# Patient Record
Sex: Male | Born: 1955
Health system: Southern US, Community
[De-identification: ages and names within clinical notes are randomized; demographics above are authoritative.]

## PROBLEM LIST (undated history)

## (undated) DIAGNOSIS — E785 Hyperlipidemia, unspecified: Secondary | ICD-10-CM

## (undated) DIAGNOSIS — R569 Unspecified convulsions: Secondary | ICD-10-CM

## (undated) DIAGNOSIS — I2510 Atherosclerotic heart disease of native coronary artery without angina pectoris: Secondary | ICD-10-CM

## (undated) DIAGNOSIS — H5440 Blindness, one eye, unspecified eye: Secondary | ICD-10-CM

## (undated) HISTORY — PX: ANKLE SURGERY: SHX546

## (undated) HISTORY — PX: TONSILLECTOMY: SUR1361

## (undated) HISTORY — DX: Blindness, one eye, unspecified eye: H54.40

## (undated) HISTORY — DX: Hyperlipidemia, unspecified: E78.5

## (undated) HISTORY — DX: Atherosclerotic heart disease of native coronary artery without angina pectoris: I25.10

## (undated) HISTORY — PX: EYE SURGERY: SHX253

---

## 2008-02-17 ENCOUNTER — Encounter: Admission: RE | Admit: 2008-02-17 | Discharge: 2008-02-17 | Payer: Self-pay | Admitting: Emergency Medicine

## 2011-12-14 ENCOUNTER — Emergency Department (HOSPITAL_COMMUNITY): Payer: BC Managed Care – PPO

## 2011-12-14 ENCOUNTER — Observation Stay (HOSPITAL_COMMUNITY)
Admission: EM | Admit: 2011-12-14 | Discharge: 2011-12-15 | Disposition: A | Discharge status: Home / Self Care | Payer: BC Managed Care – PPO | Attending: Emergency Medicine | Admitting: Emergency Medicine

## 2011-12-14 ENCOUNTER — Encounter (HOSPITAL_COMMUNITY): Payer: Self-pay

## 2011-12-14 DIAGNOSIS — R079 Chest pain, unspecified: Principal | ICD-10-CM | POA: Insufficient documentation

## 2011-12-14 DIAGNOSIS — I2510 Atherosclerotic heart disease of native coronary artery without angina pectoris: Secondary | ICD-10-CM

## 2011-12-14 DIAGNOSIS — Z8249 Family history of ischemic heart disease and other diseases of the circulatory system: Secondary | ICD-10-CM | POA: Insufficient documentation

## 2011-12-14 DIAGNOSIS — R918 Other nonspecific abnormal finding of lung field: Secondary | ICD-10-CM | POA: Insufficient documentation

## 2011-12-14 DIAGNOSIS — R0602 Shortness of breath: Secondary | ICD-10-CM | POA: Insufficient documentation

## 2011-12-14 DIAGNOSIS — R51 Headache: Secondary | ICD-10-CM | POA: Insufficient documentation

## 2011-12-14 HISTORY — DX: Unspecified convulsions: R56.9

## 2011-12-14 LAB — CBC
HCT: 39.9 % (ref 39.0–52.0)
Hemoglobin: 13.7 g/dL (ref 13.0–17.0)
MCH: 32.8 pg (ref 26.0–34.0)
MCHC: 34.3 g/dL (ref 30.0–36.0)
MCV: 95.5 fL (ref 78.0–100.0)
Platelets: 228 K/uL (ref 150–400)
RBC: 4.18 MIL/uL — ABNORMAL LOW (ref 4.22–5.81)
RDW: 12.6 % (ref 11.5–15.5)
WBC: 6.8 K/uL (ref 4.0–10.5)

## 2011-12-14 LAB — BASIC METABOLIC PANEL WITH GFR
BUN: 17 mg/dL (ref 6–23)
CO2: 23 meq/L (ref 19–32)
Calcium: 8.8 mg/dL (ref 8.4–10.5)
Chloride: 103 meq/L (ref 96–112)
Creatinine, Ser: 1.09 mg/dL (ref 0.50–1.35)
GFR calc Af Amer: 88 mL/min — ABNORMAL LOW
GFR calc non Af Amer: 76 mL/min — ABNORMAL LOW
Glucose, Bld: 87 mg/dL (ref 70–99)
Potassium: 3.9 meq/L (ref 3.5–5.1)
Sodium: 137 meq/L (ref 135–145)

## 2011-12-14 NOTE — ED Provider Notes (Signed)
Assumed pt care in CDU.  Pt is a 56 year old male with family hx for cardiac disease is being placed under CP protocol due to having chest pain which resolved after receiving ASA and SL nitro .  Pt is cp free.  Pt has normal ECG and negative initial trop I.  Will undergo coronary CT tomorrow.    10:17 PM On exam pt in NAD, VSS, CP free, heart RRR, S1S2 no M/R/G, lung CTAB, abd soft and nontender, no pedal edema.  Will continue protocol  11:44 PM Report given to dr. Clarene Duke at end of shift.  Pt to have coronary CT tomorrow morning.   Fayrene Helper, PA-C 12/14/11 2344

## 2011-12-14 NOTE — ED Provider Notes (Signed)
History     CSN: [id number]  Arrival date & time 12/14/11  1557   First MD Initiated Contact with Patient 12/14/11 1650      Chief Complaint  Patient presents with  . Chest Pain    (Consider location/radiation/quality/duration/timing/severity/associated sxs/prior treatment) HPI Comments: Patient with no past cardiac history presents with complaint of midsternal chest tightness that began approximately 12:30. Patient went to [facility] with the pain and was given nitroglycerin and aspirin. Pain resolved upon administration of nitroglycerin. Patient states that the pain did not radiate. It was associated with shortness of breath. It is not associated with diaphoresis, nausea, palpitations. Patient does not have a history of cardiac catheterization or stress test. Patient also notes that last night when he was running on his treadmill he had shortness of breath that was worse than usual however no chest pain.  Patient is a 56 y.o. male presenting with chest pain. The history is provided by the patient.  Chest Pain The chest pain began 3 - 5 hours ago. Duration of episode(s) is 90 minutes. Chest pain occurs constantly. The chest pain is resolved. The severity of the pain is moderate. The quality of the pain is described as tightness. The pain does not radiate. Primary symptoms include shortness of breath. Pertinent negatives for primary symptoms include no fever, no fatigue, no syncope, no cough, no wheezing, no palpitations, no abdominal pain, no nausea and no vomiting.  Pertinent negatives for associated symptoms include no diaphoresis.  Pertinent negatives for past medical history include no MI.  His family medical history is significant for hypertension in family.  Procedure history is negative for cardiac catheterization, stress echo and exercise treadmill test.     Past Medical History  Diagnosis Date  . Seizures     Past Surgical History  Procedure Date  . Ankle surgery    . Eye surgery     No family history on file.  History  Substance Use Topics  . Smoking status: Never Smoker   . Smokeless tobacco: Not on file  . Alcohol Use: No      Review of Systems  Constitutional: Negative for fever, diaphoresis and fatigue.  HENT: Negative for neck pain.   Eyes: Negative for redness.  Respiratory: Positive for shortness of breath. Negative for cough and wheezing.   Cardiovascular: Positive for chest pain. Negative for palpitations, leg swelling and syncope.  Gastrointestinal: Negative for nausea, vomiting and abdominal pain.  Genitourinary: Negative for dysuria.  Musculoskeletal: Negative for back pain.  Skin: Negative for rash.  Neurological: Negative for syncope and light-headedness.    Allergies  Review of patient's allergies indicates no known allergies.  Home Medications   Current Outpatient Rx  Name Route Sig Dispense Refill  . ASPIRIN 81 MG PO CHEW Oral Chew 81 mg by mouth daily.    Marland Kitchen CARBAMAZEPINE ER 200 MG PO TB12 Oral Take 200-300 mg by mouth 2 (two) times daily. Takes 200 mg in the morning and 300 mg at night    . ADULT MULTIVITAMIN W/MINERALS CH Oral Take 1 tablet by mouth daily.      BP 101/58  Pulse 64  Temp 98.1 F (36.7 C) (Oral)  Resp 15  Ht 5\' 7"  (1.702 m)  Wt 145 lb (65.772 kg)  BMI 22.71 kg/m2  SpO2 100%  Physical Exam  Nursing note and vitals reviewed. Constitutional: He appears well-developed and well-nourished.  HENT:  Head: Normocephalic and atraumatic.  Mouth/Throat: Mucous membranes are normal. Mucous membranes  are not dry.  Eyes: Conjunctivae are normal.  Neck: Trachea normal and normal range of motion. Neck supple. Normal carotid pulses and no JVD present. No muscular tenderness present. Carotid bruit is not present. No tracheal deviation present.  Cardiovascular: Normal rate, regular rhythm, S1 normal, S2 normal, normal heart sounds and intact distal pulses.  Exam reveals no distant heart sounds and no  decreased pulses.   No murmur heard. Pulmonary/Chest: Effort normal and breath sounds normal. No respiratory distress. He has no wheezes. He exhibits no tenderness.  Abdominal: Soft. Normal aorta and bowel sounds are normal. There is no tenderness. There is no rebound and no guarding.  Musculoskeletal: He exhibits no edema.  Neurological: He is alert.  Skin: Skin is warm and dry. He is not diaphoretic. No cyanosis. No pallor.  Psychiatric: He has a normal mood and affect.    ED Course  Procedures (including critical care time)  Labs Reviewed  CBC - Abnormal; Notable for the following:    RBC 4.18 (*)     All other components within normal limits  BASIC METABOLIC PANEL - Abnormal; Notable for the following:    GFR calc non Af Amer 76 (*)     GFR calc Af Amer 88 (*)     All other components within normal limits   Dg Chest 2 View  12/14/2011  *RADIOLOGY REPORT*  Clinical Data: 56 year old male with chest pain and shortness of breath.  CHEST - 2 VIEW  Comparison: None.  Findings: Normal lung volumes.  No pneumothorax, pulmonary edema or pleural effusion.  No confluent pulmonary opacity.  EKG button artifact suspected over the right hilum.  Cardiac size and mediastinal contours are within normal limits.  Visualized tracheal air column is within normal limits.  Mild scoliosis. No acute osseous abnormality identified.  IMPRESSION: Negative, no acute cardiopulmonary abnormality.   Original Report Authenticated By: Harley Hallmark, M.D.      1. Chest pain     5:06 PM Patient seen and examined. EKG reviewed. ASA given PTA. Work-up initiated.   Vital signs reviewed and are as follows: Filed Vitals:   12/14/11 1608  BP: 101/58  Pulse: 64  Temp: 98.1 F (36.7 C)  Resp: 15   5:20 PM D/w Dr. Effie Shy. Anticipate CPP if markers negative.   6:47 PM Per mini lab -- trop I neg 0.00. Not crossing over in EPIC.   Patient placed on CDU CPP. Handoff to Ardelle Park.    MDM  CPP, pending coronary  CT in AM.         Renne Crigler, PA 12/14/11 2316

## 2011-12-14 NOTE — ED Provider Notes (Signed)
Kenneth Olson is a 56 y.o. male nonspecific chest pain, with negative initial evaluation ED. Patient has elevated cardiac risk factors and will need risk stratification. His good candidate for cardiac CT, to evaluate for potential cardiac disease. He would be stable for discharge if the cardiac CT, does not show elevated calcium score. Patient is calm, comfortable, and without pain in the emergency department.   Medical screening examination/treatment/procedure(s) were conducted as a shared visit with non-physician practitioner(s) and myself.  I personally evaluated the patient during the encounter  Flint Melter, MD 12/15/11 [phone]

## 2011-12-14 NOTE — ED Notes (Signed)
Patient was brought in by ambulance from his Physician's office with chest pain onset at 1230 when he woke up from a nap with SOB. Patient stated that when he was using the treadmill last night he said he ran out of breath quickly but no chest pain. Patient was given 4 Baby ASA and 2 NTG SL PTA with relief. Patient is A/A/Ox4, skin is warm and dry, respiration is even and unlabored.

## 2011-12-15 ENCOUNTER — Observation Stay (HOSPITAL_COMMUNITY): Payer: BC Managed Care – PPO

## 2011-12-15 LAB — POCT I-STAT TROPONIN I
Troponin i, poc: 0 ng/mL (ref 0.00–0.08)
Troponin i, poc: 0 ng/mL (ref 0.00–0.08)

## 2011-12-15 IMAGING — CT CT HEART MORP W/ CTA COR W/ SCORE W/ CA W/CM &/OR W/O CM
1 series · 14 of 20 positions shown, 18 images · IV contrast (omnipaque)
Comparison: Chest radiograph 12/14/2011

INDICATION: 55 year-old male with chest pain.

CT ANGIOGRAPHY OF THE HEART, CORONARY ARTERY, STRUCTURE, AND
MORPHOLOGY
CONTRAST: 80mL OMNIPAQUE IOHEXOL 350 MG/ML SOLN
TECHNIQUE: CT angiography of the coronary vessels was performed on
a 256 channel system using prospective ECG gating.  A scout and
noncontrast exam (for calcium scoring) were performed.  Circulation
time was measured using a test bolus.  Coronary CTA was performed
with sub mm slice collimation during portions of the cardiac cycle
after prior injection of iodinated contrast.  Imaging post
processing was performed on an independent workstation creating
multiplanar and 3-D images, and quantitative analysis of the heart
and coronary arteries.  Note that this exam targets the heart and
the chest was not imaged in its entirety.
PREMEDICATION:
Lopressor 50 mg, P.O.
Nitroglycerin 0.4 mcg, sublingual.

[Series 2: calcium score · axial · 0.49mm/px · z∈[-215,-110]mm · 14 of 48 slices shown, 18 images]
[im 3/48  vessel]
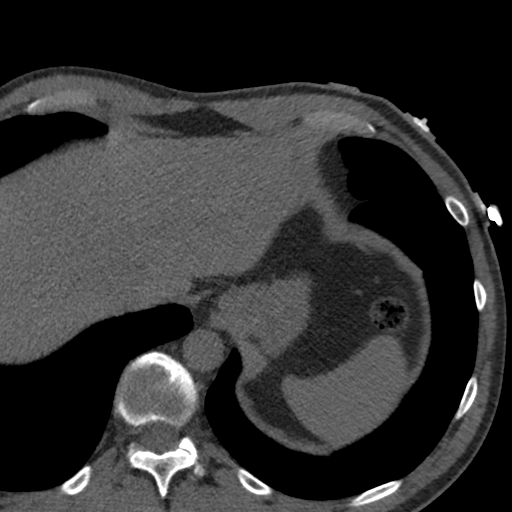
[im 3/48  lung]
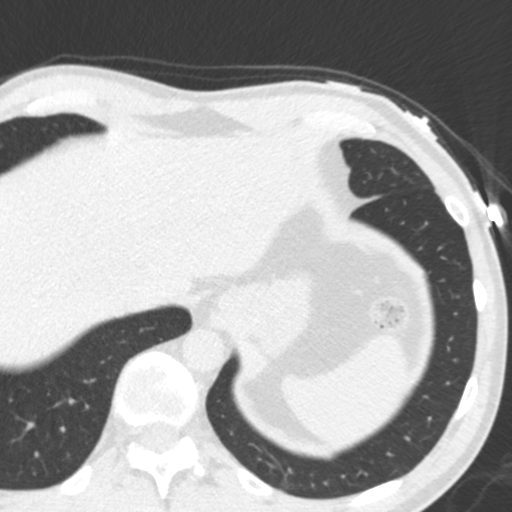
[im 5/48  vessel]
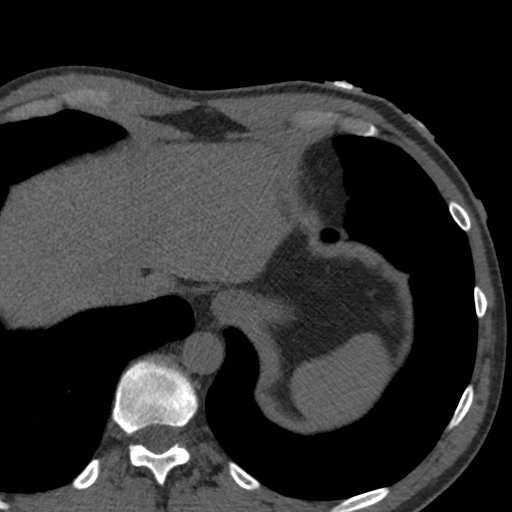
[im 10/48  vessel]
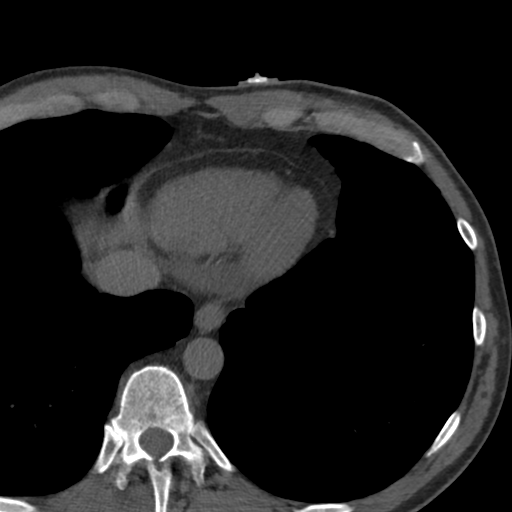
[im 13/48  vessel]
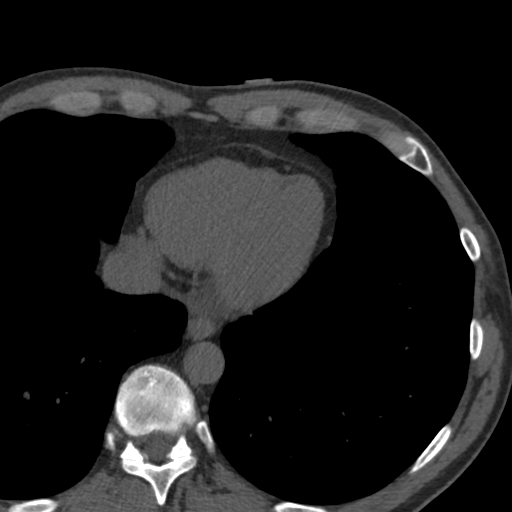
[im 15/48  vessel]
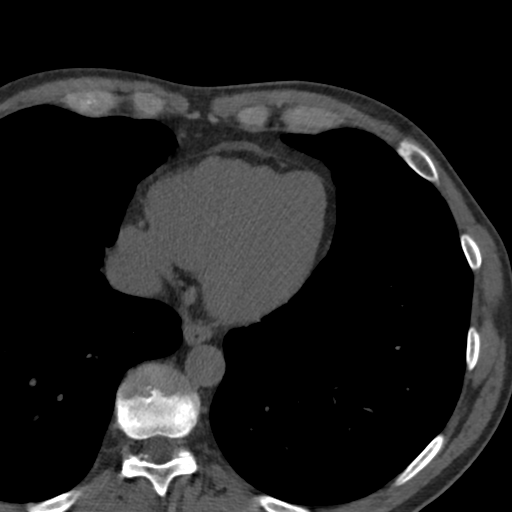
[im 15/48  lung]
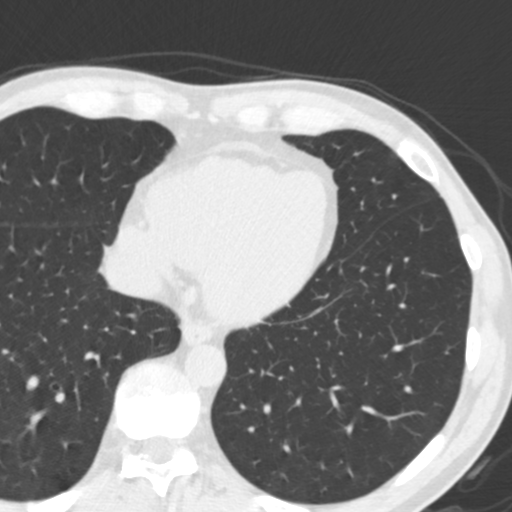
[im 20/48  vessel]
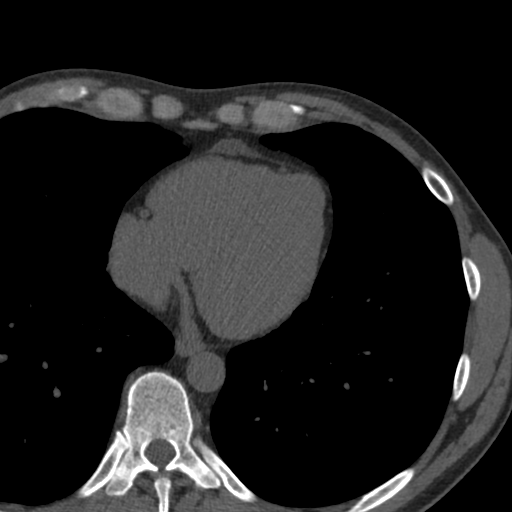
[im 23/48  vessel]
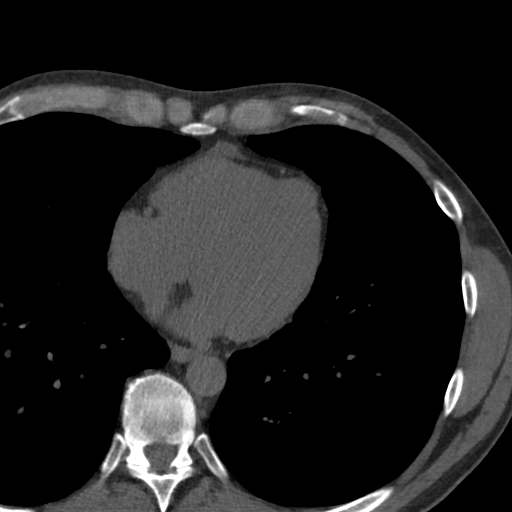
[im 25/48  vessel]
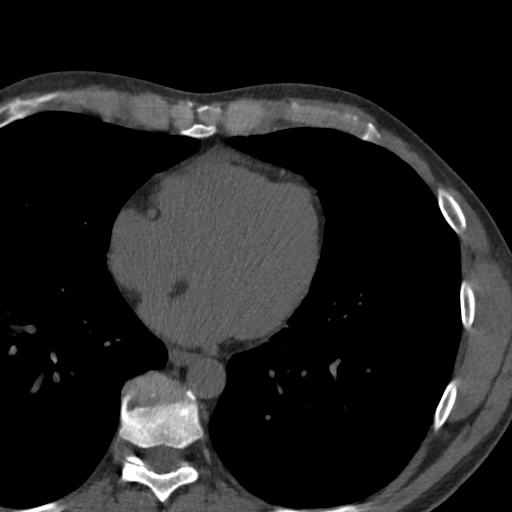
[im 28/48  vessel]
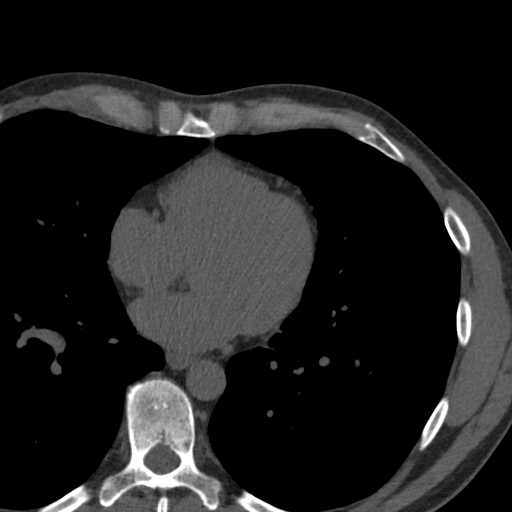
[im 28/48  lung]
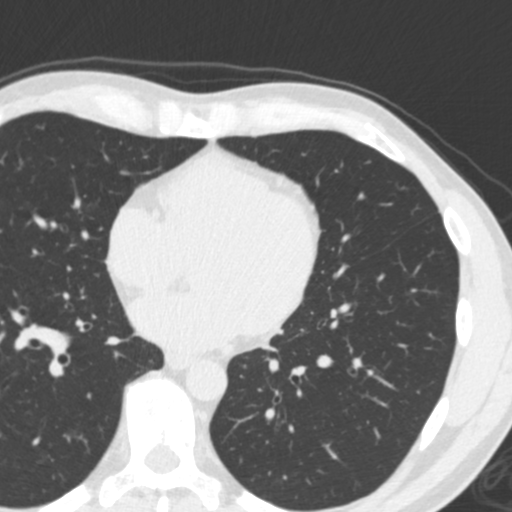
[im 33/48  vessel]
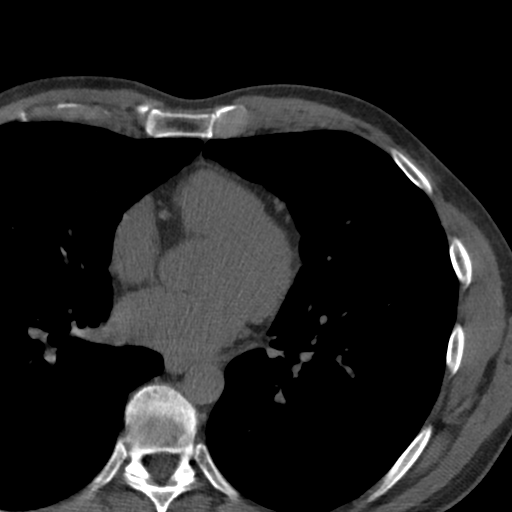
[im 35/48  vessel]
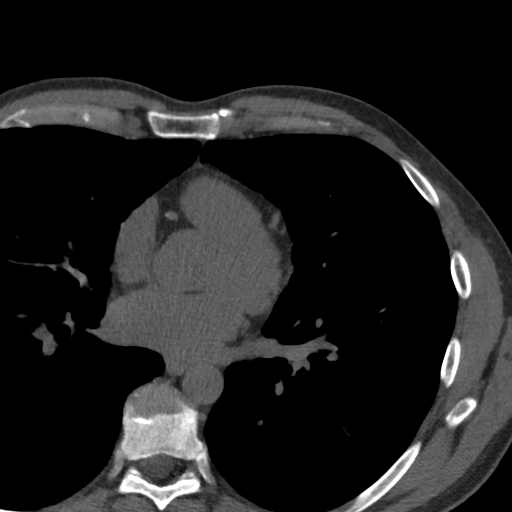
[im 38/48  vessel]
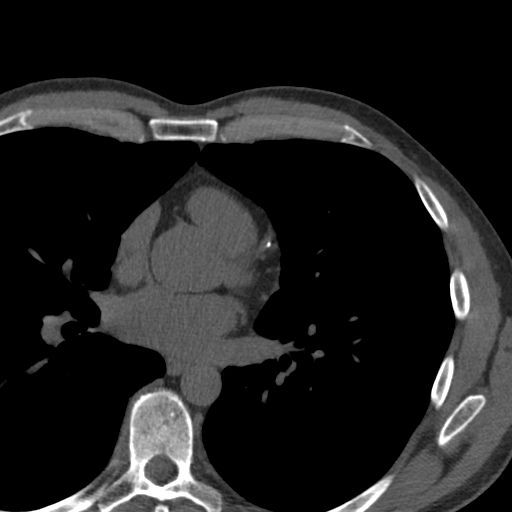
[im 43/48  vessel]
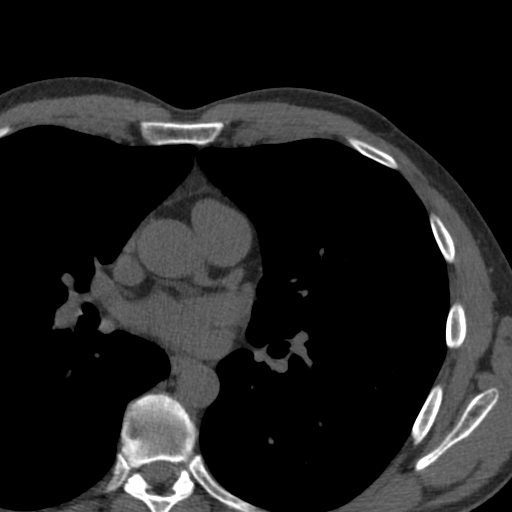
[im 43/48  lung]
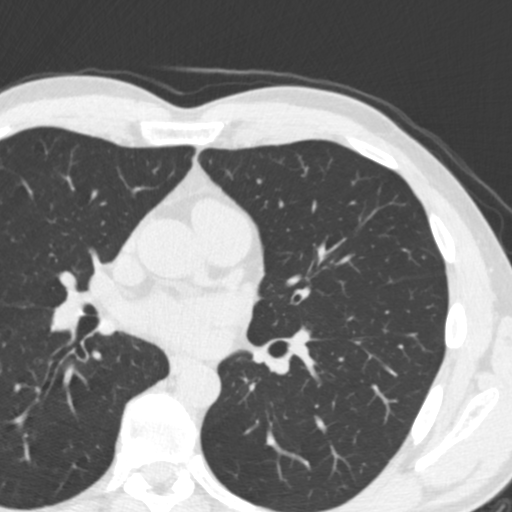
[im 45/48  vessel]
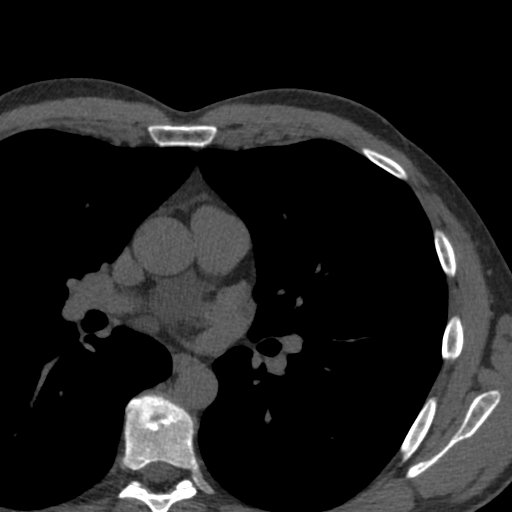

[14 of 20 positions shown; findings below may reference images not displayed]

FINDINGS: Technical quality:  Good

Heart rate:  57 beats per minute

CORONARY ARTERIES:
Left main coronary artery:  Normal location and widely patent.
Left anterior descending:  There are foci of calcified plaque
involving the LAD.  There is mild narrowing of the distal LAD
associated with calcified and noncalcified plaque.  This appears to
be less than 50% narrowing.  There are small diagonal branches.
Left circumflex Left circumflex:  Patent with a prominent obtuse
marginal branch.
Right coronary artery:  Small amount of calcified plaque without
occlusive disease.
Posterior descending artery:  Normal.
Dominance:  Right

CORONARY CALCIUM:
Total Agatston Score:
[HOSPITAL] percentile:  82%

CARDIAC MEASUREMENTS:
Interventricular septum (6 - 12 mm):  9 mm
LV posterior wall (6 - 12 mm):  13 mm
LV diameter in diastole (35 - 52 mm):  33 mm

AORTA AND PULMONARY MEASUREMENTS:
Aortic root (21 - 40 mm):

            28 mm  at the annulus
            35 mm  at the sinuses of Valsalva
            25 mm  at the sinotubular junction
Ascending aorta ( <  40 mm):  26 mm
Descending aorta ( <  40 mm):  20 mm
Main pulmonary artery:  ( <  30 mm):  23 mm

EXTRACARDIAC FINDINGS:
Small amount of fluid within the pericardial recess.  No evidence
for chest lymphadenopathy.  No evidence for a central pulmonary
embolism.  There is a tiny subpleural nodule measuring 3 mm on
sequence #4, image 8.  2 mm peripheral nodule in the left lower
lobe on image 14, sequence #4. Otherwise, the visualized lungs are
clear
IMPRESSION: 1.  Nonocclusive coronary artery disease.  The patient's total
coronary artery calcium score is 86.4, which is 82% percentile for
patient's matched age and gender.
2.  They are two small left pulmonary nodules.  These findings are
nonspecific. If the patient is at high risk for bronchogenic
carcinoma, follow-up chest CT at 1 year is recommended.  If the
patient is at low risk, no follow-up is needed.  This
recommendation follows the consensus statement: Guidelines for
Management of Small Pulmonary Nodules Detected on CT Scans:  A
Statement from the [HOSPITAL] as published in Radiology
7333; [DATE].
3.  Right coronary artery dominance.

12/15/2011.

## 2011-12-15 MED ORDER — ACETAMINOPHEN 325 MG PO TABS
975.0000 mg | ORAL_TABLET | Freq: Four times a day (QID) | ORAL | Status: DC | PRN
  Administered 2011-12-15: 975 mg via ORAL
  Filled 2011-12-15: qty 3

## 2011-12-15 MED ORDER — IOHEXOL 350 MG/ML SOLN
80.0000 mL | Freq: Once | INTRAVENOUS | Status: AC | PRN
  Administered 2011-12-15: 80 mL via INTRAVENOUS

## 2011-12-15 MED ORDER — METOPROLOL TARTRATE 1 MG/ML IV SOLN
INTRAVENOUS | Status: AC
  Filled 2011-12-15: qty 15

## 2011-12-15 MED ORDER — ACETAMINOPHEN 500 MG PO TABS
1000.0000 mg | ORAL_TABLET | Freq: Once | ORAL | Status: DC
  Filled 2011-12-15: qty 2

## 2011-12-15 MED ORDER — METOPROLOL TARTRATE 25 MG PO TABS
50.0000 mg | ORAL_TABLET | Freq: Once | ORAL | Status: AC
  Administered 2011-12-15: 50 mg via ORAL
  Filled 2011-12-15: qty 2

## 2011-12-15 NOTE — ED Provider Notes (Signed)
7:35 AM BP 113/61  Pulse 61  Temp 98.5 F (36.9 C) (Oral)  Resp 14  Ht 5\' 7"  (1.702 m)  Wt 145 lb (65.772 kg)  BMI 22.71 kg/m2  SpO2 100% A single care of patient in CDU. Patient is here on chest pain protocol. Had an episode of substernal chest pain associated with tightness yesterday. Relieved with nitroglycerin and aspirin. Negative troponins and EKG. Positive family history of cardiac disease. Heart rate is running approximately 50  for right now after Lopressor. He is awaiting transport to get his CT angiogram.  9:19 AM Patient is complaining of headache after nitroglycerin administration and CT A. Will give him Tylenol.  9:56 AM CTA report back showing some vessel disease, but less than 50% narrowing. He also has some pulmonary nodules found on the CT.  Patient will be discharged today with cardiology follow up  Regarding his heart and PCP regarding nodules. Spoke with patient and all questions answered fully.  He is safe for discharge at this time. Discussed reasons to seek immediate care. Patient expresses understanding and agrees with plan.   Arthor Captain, PA-C 12/15/11 1017

## 2011-12-15 NOTE — ED Provider Notes (Signed)
Medical screening examination/treatment/procedure(s) were conducted as a shared visit with non-physician practitioner(s) and myself.  I personally evaluated the patient during the encounter  Flint Melter, MD 12/15/11 Moses Manners

## 2011-12-19 NOTE — ED Provider Notes (Signed)
Medical screening examination/treatment/procedure(s) were performed by non-physician practitioner and as supervising physician I was immediately available for consultation/collaboration.  Hurman Horn, MD 12/19/11 [phone]

## 2012-01-10 ENCOUNTER — Encounter: Payer: Self-pay | Admitting: *Deleted

## 2012-01-10 ENCOUNTER — Encounter: Payer: Self-pay | Admitting: Cardiology

## 2012-01-13 ENCOUNTER — Encounter: Payer: Self-pay | Admitting: *Deleted

## 2012-01-13 ENCOUNTER — Ambulatory Visit (INDEPENDENT_AMBULATORY_CARE_PROVIDER_SITE_OTHER): Payer: BC Managed Care – PPO | Admitting: Cardiology

## 2012-01-13 VITALS — BP 128/76 | HR 57 | Ht 67.0 in | Wt 148.0 lb

## 2012-01-13 DIAGNOSIS — I25810 Atherosclerosis of coronary artery bypass graft(s) without angina pectoris: Secondary | ICD-10-CM

## 2012-01-13 DIAGNOSIS — E785 Hyperlipidemia, unspecified: Secondary | ICD-10-CM

## 2012-01-13 MED ORDER — PRAVASTATIN SODIUM 40 MG PO TABS: 40.0000 mg | ORAL_TABLET | Freq: Every day | ORAL | Status: DC

## 2012-01-13 NOTE — Assessment & Plan Note (Signed)
Given documented plaque will add Pravachol 40 mg daily and check lipids and liver in 6 weeks.

## 2012-01-13 NOTE — Patient Instructions (Addendum)
Your physician recommends that you schedule a follow-up appointment in: 4-6 weeks  Your physician has recommended you make the following change in your medication:   START PRAVACHOL 40 MG DAILY  Your physician recommends that you return for a FASTING lipid profile: 4-6 WEEKS ON FOLLOW UP APPOINTMENT

## 2012-01-13 NOTE — Progress Notes (Signed)
  HPI: 56 year old male with no prior cardiac history for evaluation of chest pain. Patient recently seen in August of 2013 in the emergency room with chest pain. Cardiac markers negative, hemoglobin 13.7, BUN and creatinine 17 and 1.09. Chest x-ray negative. Cardiac CT revealed normal left main. There was calcified plaque in the LAD. This appeared to be less than 50%. The circumflex was patent. There was mild plaque in the right coronary artery and PDA was normal. Calcium score 86.4%. There were small left pulmonary nodules and followup recommended. No large pulmonary embolus. On the day he was seen in the emergency room he had used a chainsaw. He subsequently developed a dull substernal chest pain that did not radiate. It was not pleuritic or positional. There was associated dyspnea but no nausea or diaphoresis.  It lasted approximately 5 hours and resolved. Since that time he has noticed some increased dyspnea on exertion and fatigue. He occasionally has had 30 seconds of substernal dull chest pain not related to exertion.  Current Outpatient Prescriptions  Medication Sig Dispense Refill  . aspirin 81 MG chewable tablet Chew 81 mg by mouth daily.      . carbamazepine (TEGRETOL XR) 200 MG 12 hr tablet Take 200-300 mg by mouth 2 (two) times daily. Takes 200 mg in the morning and 300 mg at night      . Multiple Vitamin (MULTIVITAMIN WITH MINERALS) TABS Take 1 tablet by mouth daily.      Marland Kitchen neomycin-polymyxin b-dexamethasone (MAXITROL) 3.5-10000-0.1 OINT prn        No Known Allergies  Past Medical History  Diagnosis Date  . Seizures   . Chest pain   . Hyperlipidemia   . Blindness of right eye     childhood incident    Past Surgical History  Procedure Date  . Ankle surgery   . Eye surgery     History   Social History  . Marital Status: Single    Spouse Name: N/A    Number of Children: N/A  . Years of Education: N/A   Occupational History  . Not on file.   Social History Main Topics   . Smoking status: Never Smoker   . Smokeless tobacco: Not on file  . Alcohol Use: No  . Drug Use: No  . Sexually Active:    Other Topics Concern  . Not on file   Social History Narrative  . No narrative on file    Family History  Problem Relation Age of Onset  . Stroke Mother   . Heart attack Father     ROS:  no fevers or chills, productive cough, hemoptysis, dysphasia, odynophagia, melena, hematochezia, dysuria, hematuria, rash, seizure activity, orthopnea, PND, pedal edema, claudication. Remaining systems are negative.  Physical Exam:   Blood pressure 128/76, pulse 57, height 5\' 7"  (1.702 m), weight 148 lb (67.132 kg).  General:  Well developed/well nourished in NAD Skin warm/dry Patient not depressed No peripheral clubbing Back-normal HEENT-normal/normal eyelids Neck supple/normal carotid upstroke bilaterally; no bruits; no JVD; no thyromegaly chest - CTA/ normal expansion CV - RRR/normal S1 and S2; no murmurs, rubs or gallops;  PMI nondisplaced Abdomen -NT/ND, no HSM, no mass, + bowel sounds, no bruit 2+ femoral pulses, no bruits Ext-no edema, chords, 2+ DP Neuro-grossly nonfocal  ECG sinus rhythm at a rate of 57. No ST changes.

## 2012-01-13 NOTE — Assessment & Plan Note (Signed)
Patient had an episode of chest pain and etiology not clear. Enzymes were negative. Cardiac CT showed plaque but no obstructive disease. There was no large pulmonary embolus and he has no risk factors for this particular problem. Plan continue aspirin. Add Pravachol 40 mg daily. Increase activities. If recurrent symptoms in the future he may require cardiac catheterization for more definitive evaluation. Note his electrocardiogram is normal.

## 2012-02-10 ENCOUNTER — Telehealth: Payer: Self-pay | Admitting: Cardiology

## 2012-02-10 NOTE — Telephone Encounter (Signed)
Pt started Pravastatin about a month ago.  He has had a pimple-like rash since that time.  It lasted a couple weeks ago and now started again.  He has also been having dizziness and an off-balance type feeling off and on since starting the pravastatin which has been getting progressively worse.   He was told to stop the pravastatin and that I will forward this message to Dr Jens Som for his advice.  Pt agrees.

## 2012-02-10 NOTE — Telephone Encounter (Signed)
Dc pravachol and begin lipitor 10 mg po daily with lipids and liver in six weeks Kenneth Olson

## 2012-02-10 NOTE — Telephone Encounter (Signed)
New Problem:    Patient called in because after taking his pravastatin (PRAVACHOL) 40 MG tablet he is dizzy and his face has broken out in adult acne.  Patient called in wanting to know if there is any other statin medication that he could take.  Please call back.

## 2012-02-12 MED ORDER — ATORVASTATIN CALCIUM 10 MG PO TABS: 10.0000 mg | ORAL_TABLET | Freq: Every day | ORAL | Status: DC

## 2012-02-12 NOTE — Telephone Encounter (Signed)
Spoke with pt, aware of med change. Script sent to [facility]

## 2012-02-12 NOTE — Telephone Encounter (Signed)
Follow-up:    Patient returned your call.  Please call back. 

## 2012-02-12 NOTE — Telephone Encounter (Signed)
Left message for pt to call.

## 2012-02-20 ENCOUNTER — Other Ambulatory Visit: Payer: BC Managed Care – PPO

## 2012-02-20 ENCOUNTER — Ambulatory Visit: Payer: BC Managed Care – PPO | Admitting: Cardiology

## 2012-03-09 ENCOUNTER — Encounter: Payer: BC Managed Care – PPO | Admitting: Cardiology

## 2012-03-09 ENCOUNTER — Encounter: Payer: Self-pay | Admitting: Cardiology

## 2012-03-09 ENCOUNTER — Other Ambulatory Visit: Payer: BC Managed Care – PPO

## 2012-03-09 ENCOUNTER — Telehealth: Payer: Self-pay | Admitting: Cardiology

## 2012-03-09 NOTE — Progress Notes (Signed)
   HPI: Pleasant male for fu of CAD. Patient seen in August of 2013 in the emergency room with chest pain. Cardiac markers negative, hemoglobin 13.7, BUN and creatinine 17 and 1.09. Chest x-ray negative. Cardiac CT revealed normal left main. There was calcified plaque in the LAD. This appeared to be less than 50%. The circumflex was patent. There was mild plaque in the right coronary artery and PDA was normal. Calcium score 86.4%. There were small left pulmonary nodules and followup recommended. No large pulmonary embolus. Since I saw him in Sept 2013,    Current Outpatient Prescriptions  Medication Sig Dispense Refill  . aspirin 81 MG chewable tablet Chew 81 mg by mouth daily.      Marland Kitchen atorvastatin (LIPITOR) 10 MG tablet Take 1 tablet (10 mg total) by mouth daily.  30 tablet  12  . carbamazepine (TEGRETOL XR) 200 MG 12 hr tablet Take 200-300 mg by mouth 2 (two) times daily. Takes 200 mg in the morning and 300 mg at night      . Multiple Vitamin (MULTIVITAMIN WITH MINERALS) TABS Take 1 tablet by mouth daily.      Marland Kitchen neomycin-polymyxin b-dexamethasone (MAXITROL) 3.5-10000-0.1 OINT prn         Past Medical History  Diagnosis Date  . Seizures   . Hyperlipidemia   . Blindness of right eye     childhood incident  . CAD (coronary artery disease)     Past Surgical History  Procedure Date  . Ankle surgery   . Eye surgery   . Tonsillectomy     History   Social History  . Marital Status: Married    Spouse Name: N/A    Number of Children: 2  . Years of Education: N/A   Occupational History  .      IT consultant   Social History Main Topics  . Smoking status: Never Smoker   . Smokeless tobacco: Not on file  . Alcohol Use: Yes     Comment: Occasional  . Drug Use: No  . Sexually Active: Not on file   Other Topics Concern  . Not on file   Social History Narrative  . No narrative on file    ROS: no fevers or chills, productive cough, hemoptysis, dysphasia, odynophagia, melena,  hematochezia, dysuria, hematuria, rash, seizure activity, orthopnea, PND, pedal edema, claudication. Remaining systems are negative.  Physical Exam: Well-developed well-nourished in no acute distress.  Skin is warm and dry.  HEENT is normal.  Neck is supple.  Chest is clear to auscultation with normal expansion.  Cardiovascular exam is regular rate and rhythm.  Abdominal exam nontender or distended. No masses palpated. Extremities show no edema. neuro grossly intact  ECG     This encounter was created in error - please disregard.

## 2012-03-09 NOTE — Telephone Encounter (Signed)
New Problem:    Patient missed his appointment and wanted to know when he needed to be seen and if he needed to be seen by Dr. Jens Som or if a NP/PA would be ok.  Please call back.

## 2012-03-09 NOTE — Telephone Encounter (Signed)
appt made

## 2012-03-12 ENCOUNTER — Other Ambulatory Visit (INDEPENDENT_AMBULATORY_CARE_PROVIDER_SITE_OTHER): Payer: BC Managed Care – PPO

## 2012-03-12 ENCOUNTER — Ambulatory Visit (INDEPENDENT_AMBULATORY_CARE_PROVIDER_SITE_OTHER): Payer: BC Managed Care – PPO | Admitting: Nurse Practitioner

## 2012-03-12 ENCOUNTER — Encounter: Payer: Self-pay | Admitting: Nurse Practitioner

## 2012-03-12 VITALS — BP 112/84 | HR 60 | Ht 67.0 in | Wt 151.0 lb

## 2012-03-12 DIAGNOSIS — I259 Chronic ischemic heart disease, unspecified: Secondary | ICD-10-CM

## 2012-03-12 DIAGNOSIS — I25810 Atherosclerosis of coronary artery bypass graft(s) without angina pectoris: Secondary | ICD-10-CM

## 2012-03-12 DIAGNOSIS — E785 Hyperlipidemia, unspecified: Secondary | ICD-10-CM

## 2012-03-12 LAB — HEPATIC FUNCTION PANEL
Albumin: 4.3 g/dL (ref 3.5–5.2)
Total Bilirubin: 0.6 mg/dL (ref 0.3–1.2)

## 2012-03-12 LAB — LIPID PANEL
Cholesterol: 212 mg/dL — ABNORMAL HIGH (ref 0–200)
HDL: 71.7 mg/dL (ref >39.00)
Total CHOL/HDL Ratio: 3
VLDL: 21.4 mg/dL (ref 0.0–40.0)

## 2012-03-12 NOTE — Patient Instructions (Addendum)
I think you are doing well  Stay active  Stay on your medicines  We will check labs today  See Dr. Jens Som in one year  Call the [facility] office at [phone] if you have any questions, problems or concerns.

## 2012-03-12 NOTE — Progress Notes (Signed)
Anne Fu Date of Birth: 1955/09/25 Medical Record #[id number]  History of Present Illness: Mr. Muldrew is seen today for a work in visit. He is seen for Dr. Jens Som. He was here in September after an ER visit for chest pain. Cardiac CT showed no PE, normal left main, calcified plaque in the LAD which appeared to be less than 50%, patent LCX, mild plaque in the RCA and the PDA was normal. Small pulmonary nodules noted and follow up was recommended. 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   He comes in today. He is here alone. He is doing very well. No more chest pain. Not short of breath. Exercises regularly without issue. Did not tolerate Pravachol but doing well on Lipitor. Needs labs today.   Current Outpatient Prescriptions on File Prior to Visit  Medication Sig Dispense Refill  . aspirin 81 MG chewable tablet Chew 81 mg by mouth daily.      Marland Kitchen atorvastatin (LIPITOR) 10 MG tablet Take 1 tablet (10 mg total) by mouth daily.  30 tablet  12  . carbamazepine (TEGRETOL XR) 200 MG 12 hr tablet Take 200-300 mg by mouth 2 (two) times daily. Takes 200 mg in the morning and 300 mg at night      . Multiple Vitamin (MULTIVITAMIN WITH MINERALS) TABS Take 1 tablet by mouth daily.      Marland Kitchen neomycin-polymyxin b-dexamethasone (MAXITROL) 3.5-10000-0.1 OINT 1 application as needed. prn        Allergies  Allergen Reactions  . Pravastatin     Dizziness; acne    Past Medical History  Diagnosis Date  . Seizures   . Hyperlipidemia   . Blindness of right eye     childhood incident  . CAD (coronary artery disease)     Past Surgical History  Procedure Date  . Ankle surgery   . Eye surgery   . Tonsillectomy     History  Smoking status  . Never Smoker   Smokeless tobacco  . Not on file    History  Alcohol Use  . Yes    Comment: Occasional    Family History  Problem Relation Age of Onset  . Stroke Mother   . Heart attack  Father     MI at age 75  . Heart disease Father     Review of Systems: The review of systems is per the HPI.  All other systems were reviewed and are negative.  Physical Exam: BP 112/84  Pulse 60  Ht 5\' 7"  (1.702 m)  Wt 151 lb (68.493 kg)  BMI 23.65 kg/m2 Patient is very pleasant and in no acute distress. Skin is warm and dry. Color is normal.  HEENT is unremarkable. Normocephalic/atraumatic. PERRL. Sclera are nonicteric. Neck is supple. No masses. No JVD. Lungs are clear. Cardiac exam shows a regular rate and rhythm. Abdomen is soft. Extremities are without edema. Gait and ROM are intact. No gross neurologic deficits noted.   LABORATORY DATA:  Lab Results  Component Value Date   WBC 6.8 12/14/2011   HGB 13.7 12/14/2011   HCT 39.9 12/14/2011   PLT 228 12/14/2011   GLUCOSE 87 12/14/2011   NA 137 12/14/2011   K 3.9 12/14/2011   CL 103 12/14/2011   CREATININE 1.09 12/14/2011   BUN 17 12/14/2011   CO2 23 12/14/2011   Dg Chest 2 View  12/14/2011  *RADIOLOGY REPORT*  Clinical Data: 56 year old male with chest  pain and shortness of breath.  CHEST - 2 VIEW  Comparison: None.  Findings: Normal lung volumes.  No pneumothorax, pulmonary edema or pleural effusion.  No confluent pulmonary opacity.  EKG button artifact suspected over the right hilum.  Cardiac size and mediastinal contours are within normal limits.  Visualized tracheal air column is within normal limits.  Mild scoliosis. No acute osseous abnormality identified.  IMPRESSION: Negative, no acute cardiopulmonary abnormality.   Original Report Authenticated By: Harley Hallmark, M.D.    Ct Heart Morp W/cta Cor W/score W/ca W/cm &/or Wo/cm  12/15/2011  *RADIOLOGY REPORT*  INDICATION:  56 year-old male with chest pain.  CT ANGIOGRAPHY OF THE HEART, CORONARY ARTERY, STRUCTURE, AND MORPHOLOGY  CONTRAST: 80mL OMNIPAQUE IOHEXOL 350 MG/ML SOLN  COMPARISON:  Chest radiograph 12/14/2011  TECHNIQUE:  CT angiography of the coronary vessels was performed on  a 256 channel system using prospective ECG gating.  A scout and noncontrast exam (for calcium scoring) were performed.  Circulation time was measured using a test bolus.  Coronary CTA was performed with sub mm slice collimation during portions of the cardiac cycle after prior injection of iodinated contrast.  Imaging post processing was performed on an independent workstation creating multiplanar and 3-D images, and quantitative analysis of the heart and coronary arteries.  Note that this exam targets the heart and the chest was not imaged in its entirety.  PREMEDICATION: Lopressor 50 mg, P.O. Nitroglycerin 0.4 mcg, sublingual.  FINDINGS: Technical quality:  Good  Heart rate:  57 beats per minute  CORONARY ARTERIES: Left main coronary artery:  Normal location and widely patent. Left anterior descending:  There are foci of calcified plaque involving the LAD.  There is mild narrowing of the distal LAD associated with calcified and noncalcified plaque.  This appears to be less than 50% narrowing.  There are small diagonal branches. Left circumflex Left circumflex:  Patent with a prominent obtuse marginal branch. Right coronary artery:  Small amount of calcified plaque without occlusive disease. Posterior descending artery:  Normal. Dominance:  Right  CORONARY CALCIUM: Total Agatston Score:  86.4 MESA database percentile:  82%  CARDIAC MEASUREMENTS: Interventricular septum (6 - 12 mm):  9 mm LV posterior wall (6 - 12 mm):  13 mm LV diameter in diastole (35 - 52 mm):  33 mm  AORTA AND PULMONARY MEASUREMENTS: Aortic root (21 - 40 mm):              28 mm  at the annulus             35 mm  at the sinuses of Valsalva             25 mm  at the sinotubular junction Ascending aorta ( <  40 mm):  26 mm Descending aorta ( <  40 mm):  20 mm Main pulmonary artery:  ( <  30 mm):  23 mm  EXTRACARDIAC FINDINGS: Small amount of fluid within the pericardial recess.  No evidence for chest lymphadenopathy.  No evidence for a central  pulmonary embolism.  There is a tiny subpleural nodule measuring 3 mm on sequence #4, image 8.  2 mm peripheral nodule in the left lower lobe on image 14, sequence #4. Otherwise, the visualized lungs are clear  IMPRESSION:  1.  Nonocclusive coronary artery disease.  The patient's total coronary artery calcium score is 86.4, which is 82% percentile for patient's matched age and gender. 2.  They are two small left pulmonary nodules.  These findings are nonspecific. If the patient is at high risk for bronchogenic carcinoma, follow-up chest CT at 1 year is recommended.  If the patient is at low risk, no follow-up is needed.  This recommendation follows the consensus statement: Guidelines for Management of Small Pulmonary Nodules Detected on CT Scans:  A Statement from the Fleischner Society as published in Radiology 2005; 237:395-400. 3.  Right coronary artery dominance.  Report was called to CDU mid level at [phone] at 9:49 a.m. on 12/15/2011.   Original Report Authenticated By: Richarda Overlie, M.D.     Assessment / Plan: 1. Past episode of atypical chest pain - Cardiac CT showing plaque but no obstructive disease. Continue risk factor modification . I will have him see Dr. Jens Som in a year.  2. HLD - did not tolerate Pravachol but doing well on Lipitor. We will check labs today.  Patient is agreeable to this plan and will call if any problems develop in the interim.

## 2012-03-13 ENCOUNTER — Other Ambulatory Visit: Payer: Self-pay | Admitting: *Deleted

## 2012-03-13 DIAGNOSIS — E7800 Pure hypercholesterolemia, unspecified: Secondary | ICD-10-CM

## 2012-03-13 MED ORDER — ATORVASTATIN CALCIUM 40 MG PO TABS: 40.0000 mg | ORAL_TABLET | Freq: Every day | ORAL | Status: DC

## 2012-06-06 ENCOUNTER — Other Ambulatory Visit: Payer: Self-pay

## 2013-02-25 ENCOUNTER — Other Ambulatory Visit: Payer: Self-pay

## 2013-04-07 ENCOUNTER — Other Ambulatory Visit: Payer: Self-pay | Admitting: Cardiology

## 2013-04-08 ENCOUNTER — Other Ambulatory Visit: Payer: Self-pay | Admitting: Cardiology

## 2013-05-08 ENCOUNTER — Other Ambulatory Visit: Payer: Self-pay | Admitting: Cardiology

## 2013-05-18 ENCOUNTER — Other Ambulatory Visit: Payer: Self-pay | Admitting: *Deleted

## 2013-05-18 MED ORDER — ATORVASTATIN CALCIUM 40 MG PO TABS: ORAL_TABLET | ORAL | Status: DC

## 2013-06-02 ENCOUNTER — Ambulatory Visit: Payer: BC Managed Care – PPO | Admitting: Nurse Practitioner

## 2017-01-20 ENCOUNTER — Ambulatory Visit (INDEPENDENT_AMBULATORY_CARE_PROVIDER_SITE_OTHER): Payer: PRIVATE HEALTH INSURANCE | Admitting: Neurology

## 2017-01-20 ENCOUNTER — Encounter: Payer: Self-pay | Admitting: Neurology

## 2017-01-20 VITALS — BP 122/78 | HR 61 | Ht 67.0 in | Wt 154.0 lb

## 2017-01-20 DIAGNOSIS — G40009 Localization-related (focal) (partial) idiopathic epilepsy and epileptic syndromes with seizures of localized onset, not intractable, without status epilepticus: Secondary | ICD-10-CM

## 2017-01-20 MED ORDER — CARBAMAZEPINE ER 200 MG PO TB12: ORAL_TABLET | ORAL | 12 refills | Status: DC

## 2017-01-20 NOTE — Progress Notes (Signed)
[facility]    Provider:  Dr Lucia Gaskins Referring Provider: Wylene Simmer Adelfa Koh, MD Primary Care Physician:  Gaspar Garbe, MD  CC:  Partial seizure  HPI:  Kenneth Olson is a 61 y.o. male here as a referral from Dr. Wylene Simmer for seizures. Past medical history of partial seizures, syncope. Partial seizures without and with generalization. Partial seizures started in his 25s, thought he was passing out, he had a complete workup and no etiology found. Once he was placed on Tegretol he had no more episodes. Last episode was 10 years ago. He knows when the seizures are coming, feels lightheaded like he is going to pass out, he has time to pull over in the car or sit down. He loses consciousness and then they generalize with bilateral shaking, last a few minutes, denies post-ictal confusion but always vomits, is tired afterwards. No tongue biting or urinating. No head trauma or inciting event. No Fhx of seizures.  Has had DEXA  And was fine. No other focal neurologic deficits, associated symptoms, inciting events or modifiable factors.    Reviewed notes, labs and imaging from outside physicians, which showed:  MRI 01/2008:  Findings: There is no evidence for acute infarction, intracranial hemorrhage, mass lesion, hydrocephalus, or extra-axial fluid. There may be mild atrophy.  There does appear to be mild small vessel disease.  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 There is mild chronic sinus disease.  The  calvarium is intact. Pituitary and cerebellar tonsils are unremarkable.   Post infusion, there is no abnormal enhancement of the brain or meninges.  The major intracranial vascular structures appear widely Patent.  EEG 2011   IMPRESSION: Prosthetic globe unremarkable, right orbit.   No abnormal intracranial enhancement and no acute intracranial Findings.  [facility] [place] Dr.[name], patient was last seen in October 2016  diagnosed with partial epilepsy. Previous to that he was seen in 2012. He is on Tegretol 200 mg 2 pills in the morning and 3 pills at night. Had dizziness on higher doses. He reported no seizures or fainting. He has a past medical history of dizziness. Higher doses of Carbatrol made him dizzy. No additional problems except fatiguing. He was started on cholesterol several years prior. Exercising 3 times per week and no medical changes. Neurologic exam was nonfocal. He was under control, carbamazepine level and vitamin D and CBC and metabolic panel were checked.  Tegretol level 01/2015 11.6  Review of Systems: Patient complains of symptoms per HPI as well as the following symptoms: Snoring, seizures, hearing loss, ringing in ears. Pertinent negatives and positives per HPI. All others negative.   Social History   Social History  . Marital status: Married    Spouse name: N/A  . Number of children: 2  . Years of education: N/A   Occupational History  .      IT consultant   Social History Main Topics  . Smoking status: Never Smoker  . Smokeless tobacco: Never Used  . Alcohol use Yes     Comment: Occasional  . Drug use: No  . Sexual activity: Yes   Other Topics Concern  . Not on file   Social History Narrative  . No narrative on file    Family History  Problem Relation Age of Onset  . Stroke Mother   . Heart attack Father        MI at age 28  . Heart disease Father   .  Seizures Neg Hx     Past Medical History:  Diagnosis Date  . Blindness of right eye    childhood incident  . CAD (coronary artery disease)   . Hyperlipidemia   . Seizures (HCC)     Past Surgical History:  Procedure Laterality Date  . ANKLE SURGERY    . EYE SURGERY    . TONSILLECTOMY      Current Outpatient Prescriptions  Medication Sig Dispense Refill  . aspirin 81 MG chewable tablet Chew 81 mg by mouth daily.    Marland Kitchen atorvastatin (LIPITOR) 40 MG tablet TAKE 1 TABLET BY MOUTH EVERY DAY 30 tablet 0    . carbamazepine (TEGRETOL XR) 200 MG 12 hr tablet Takes 400 mg in the morning and 600 mg at night 150 tablet 12  . Multiple Vitamin (MULTIVITAMIN WITH MINERALS) TABS Take 1 tablet by mouth daily.    Marland Kitchen neomycin-polymyxin b-dexamethasone (MAXITROL) 3.5-10000-0.1 OINT 1 application as needed. prn     No current facility-administered medications for this visit.     Allergies as of 01/20/2017 - Review Complete 01/20/2017  Allergen Reaction Noted  . Pravastatin  03/12/2012    Vitals: BP 122/78   Pulse 61   Ht  (1.702 m)   Wt 154 lb (69.9 kg)   BMI 24.12 kg/m  Last Weight:  Wt Readings from Last 1 Encounters:  01/20/17 154 lb (69.9 kg)   Last Height:   Ht Readings from Last 1 Encounters:  01/20/17  (1.702 m)  Is Physical exam: Exam: Gen: NAD, conversant, well nourised,  well groomed                     CV: RRR, no MRG. No Carotid Bruits. No peripheral edema, warm, nontender Eyes: Conjunctivae clear without exudates or hemorrhage  Neuro: Detailed Neurologic Exam  Speech:    Speech is normal; fluent and spontaneous with normal comprehension.  Cognition:    The patient is oriented to person, place, and time;     recent and remote memory intact;     language fluent;     normal attention, concentration,     fund of knowledge Cranial Nerves:    The pupils are equal, round, and reactive to light. The fundi are normal and spontaneous venous pulsations are present. Visual fields are full to finger confrontation in the left eye, right eye removed on Tegretol and after. Extraocular movements are intact. Trigeminal sensation is intact and the muscles of mastication are normal. The face is symmetric. The palate elevates in the midline. Hearing intact. Voice is normal. Shoulder shrug is normal. The tongue has normal motion without fasciculations.   Coordination:    Normal finger to nose and heel to shin. Normal rapid alternating movements.   Gait:    Heel-toe and tandem gait  are normal.   Motor Observation:    No asymmetry, no atrophy, and no involuntary movements noted. Tone:    Normal muscle tone.    Posture:    Posture is normal. normal erect    Strength:    Strength is V/V in the upper and lower limbs.      Sensation: intact to LT     Reflex Exam:  DTR's:    Deep tendon reflexes in the upper and lower extremities are normal bilaterally.   Toes:    The toes are downgoing bilaterally.   Clonus:    Clonus is absent.     Assessment/Plan:  61 year old gentleman with partial  seizures well-controlled on Tegretol  Continue Tegretol  Calcium Supplementation Probably Helpful, But Take at a Different Time of Day Anticonvulsant drugs may impair calcium absorption and, in this way, increase the risk of osteoporosis and other bone disorders. Follow up with PCP for management and to discuss DEXA scan.  Discussed Patients with epilepsy have a small risk of sudden unexpected death, a condition referred to as sudden unexpected death in epilepsy (SUDEP). SUDEP is defined specifically as the sudden, unexpected, witnessed or unwitnessed, nontraumatic and nondrowning death in patients with epilepsy with or without evidence for a seizure, and excluding documented status epilepticus, in which post mortem examination does not reveal a structural or toxicologic cause for death    Naomie Dean, MD  [facility] [address] [place], [place] [address]  Phone [phone] Fax [phone]

## 2017-01-20 NOTE — Patient Instructions (Addendum)
CalciumSupplementation Probably Helpful, But Take at a Different Time of Day Anticonvulsant drugs may impair calcium absorption and, in this way, increase the risk of osteoporosis and other bone disorders. Follow up with PCP for management and to discuss DEXA scan..  Continue Carbatrol   Epilepsy Epilepsy is a condition in which a person has repeated seizures over time. A seizure is a sudden burst of abnormal electrical and chemical activity in the brain. Seizures can cause a change in attention, behavior, or the ability to remain awake and alert (altered mental status). Epilepsy increases a person's risk of falls, accidents, and injury. It can also lead to complications, including:  Depression.  Poor memory.  Sudden unexplained death in epilepsy (SUDEP). This complication is rare, and its cause is not known.  Most people with epilepsy lead normal lives. What are the causes? This condition may be caused by:  A head injury.  An injury that happens at birth.  A high fever during childhood.  A stroke.  Bleeding that goes into or around the brain.  Certain medicines and drugs.  Having too little oxygen for a long period of time.  Abnormal brain development.  Certain infections, such as meningitis and encephalitis.  Brain tumors.  Conditions that are passed along from parent to child (are hereditary).  What are the signs or symptoms? Symptoms of a seizure vary greatly from person to person. They include:  Convulsions.  Stiffening of the body.  Involuntary movements of the arms or legs.  Loss of consciousness.  Breathing problems.  Falling suddenly.  Confusion.  Head nodding.  Eye blinking or fluttering.  Lip smacking.  Drooling.  Rapid eye movements.  Grunting.  Loss of bladder control and bowel control.  Staring.  Unresponsiveness.  Some people have symptoms right before a seizure happens (aura) and right after a seizure happens. Symptoms of an  aura include:  Fear or anxiety.  Nausea.  Feeling like the room is spinning (vertigo).  A feeling of having seen or heard something before (deja vu).  Odd tastes or smells.  Changes in vision, such as seeing flashing lights or spots.  Symptoms that follow a seizure include:  Confusion.  Sleepiness.  Headache.  How is this diagnosed? This condition is diagnosed based on:  Your symptoms.  Your medical history.  A physical exam.  A neurological exam. A neurological exam is similar to a physical exam. It involves checking your strength, reflexes, coordination, and sensations.  Tests, such as: ? An electroencephalogram (EEG). This is a painless test that creates a diagram of your brain waves. ? An MRI of the brain. ? A CT scan of the brain. ? A lumbar puncture, also called a spinal tap. ? Blood tests to check for signs of infection or abnormal blood chemistry.  How is this treated? There is no cure for this condition, but treatment can help control seizures. Treatment may involve:  Taking medicines to control seizures. These include medicines to prevent seizures and medicines to stop seizures as they occur.  Having a device called a vagus nerve stimulator implanted in the chest. The device sends electrical impulses to the vagus nerve and to the brain to prevent seizures. This treatment may be recommended if medicines do not help.  Brain surgery. There are several kinds of surgeries that may be done to stop seizures from happening or to reduce how often seizures happen.  Having regular blood tests. You may need to have blood tests regularly to check that you  are getting the right amount of medicine.  Once this condition has been diagnosed, it is important to begin treatment as soon as possible. For some people, epilepsy eventually goes away. Follow these instructions at home: Medicines   Take over-the-counter and prescription medicines only as told by your health  care provider.  Avoid any substances that may prevent your medicine from working properly, such as alcohol. Activity  Get enough rest. Lack of sleep can make seizures more likely to occur.  Follow instructions from your health care provider about driving, swimming, and doing any other activities that would be dangerous if you had a seizure. Educating others Teach friends and family what to do if you have a seizure. They should:  Lay you on the ground to prevent a fall.  Cushion your head and body.  Loosen any tight clothing around your neck.  Turn you on your side. If vomiting occurs, this helps keep your airway clear.  Stay with you until you recover.  Not hold you down. Holding you down will not stop the seizure.  Not put anything in your mouth.  Know whether or not you need emergency care.  General instructions  Avoid anything that has ever triggered a seizure for you.  Keep a seizure diary. Record what you remember about each seizure, especially anything that might have triggered the seizure.  Keep all follow-up visits as told by your health care provider. This is important. Contact a health care provider if:  Your seizure pattern changes.  You have symptoms of infection or another illness. This might increase your risk of having a seizure. Get help right away if:  You have a seizure that does not stop after 5 minutes.  You have several seizures in a row without a complete recovery in between seizures.  You have a seizure that makes it harder to breathe.  You have a seizure that is different from previous seizures.  You have a seizure that leaves you unable to speak or use a part of your body.  You did not wake up immediately after a seizure. This information is not intended to replace advice given to you by your health care provider. Make sure you discuss any questions you have with your health care provider. Document Released: 04/08/2005 Document Revised:  11/04/2015 Document Reviewed: 10/17/2015 Elsevier Interactive Patient Education  [employer].

## 2017-02-12 ENCOUNTER — Telehealth: Payer: Self-pay | Admitting: *Deleted

## 2017-02-12 NOTE — Telephone Encounter (Signed)
[facility]

## 2018-01-21 ENCOUNTER — Encounter: Payer: Self-pay | Admitting: Neurology

## 2018-01-21 ENCOUNTER — Ambulatory Visit: Payer: PRIVATE HEALTH INSURANCE | Admitting: Neurology

## 2018-01-21 VITALS — BP 125/81 | HR 66 | Ht 67.0 in | Wt 155.0 lb

## 2018-01-21 DIAGNOSIS — G40009 Localization-related (focal) (partial) idiopathic epilepsy and epileptic syndromes with seizures of localized onset, not intractable, without status epilepticus: Secondary | ICD-10-CM | POA: Diagnosis not present

## 2018-01-21 MED ORDER — CARBAMAZEPINE ER 200 MG PO TB12: ORAL_TABLET | ORAL | 11 refills | Status: DC

## 2018-01-21 NOTE — Progress Notes (Signed)
[facility]    Provider:  Dr Lucia Gaskins Referring Provider: Wylene Simmer Adelfa Koh, MD Primary Care Physician:  Gaspar Garbe, MD  CC:  Partial seizure  Interval History 10/22019: Extremely well controlled, compliant, no seizures, no side effects. Discussed epilepsy, he has been seizure free for over 10 years, takes medications never misses, no side effects. He sees his pcp regularly. Continue AEDs will release back to pcp and have them refill med every year.   HPI:  Kenneth Olson is a 62 y.o. male here as a referral from Dr. Wylene Simmer for seizures. Past medical history of partial seizures, syncope. Partial seizures without and with generalization. Partial seizures started in his 23s, thought he was passing out, he had a complete workup and no etiology found. Once he was placed on Tegretol he had no more episodes. Last episode was 10 years ago. He knows when the seizures are coming, feels lightheaded like he is going to pass out, he has time to pull over in the car or sit down. He loses consciousness and then they generalize with bilateral shaking, last a few minutes, denies post-ictal confusion but always vomits, is tired afterwards. No tongue biting or urinating. No head trauma or inciting event. No Fhx of seizures.  Has had DEXA  And was fine. No other focal neurologic deficits, associated symptoms, inciting events or modifiable factors.    Reviewed notes, labs and imaging from outside physicians, which showed:  MRI 01/2008:  Findings: There is no evidence for acute infarction, intracranial hemorrhage, mass lesion, hydrocephalus, or extra-axial fluid. There may be mild atrophy.  There does appear to be mild small vessel disease.  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 There is mild chronic sinus disease.  The  calvarium is intact. Pituitary and cerebellar tonsils are unremarkable.   Post infusion, there is no abnormal enhancement of the brain or meninges.  The  major intracranial vascular structures appear widely Patent.  EEG 2011   IMPRESSION: Prosthetic globe unremarkable, right orbit.   No abnormal intracranial enhancement and no acute intracranial Findings.  [facility] [place] Dr.[name], patient was last seen in October 2016 diagnosed with partial epilepsy. Previous to that he was seen in 2012. He is on Tegretol 200 mg 2 pills in the morning and 3 pills at night. Had dizziness on higher doses. He reported no seizures or fainting. He has a past medical history of dizziness. Higher doses of Carbatrol made him dizzy. No additional problems except fatiguing. He was started on cholesterol several years prior. Exercising 3 times per week and no medical changes. Neurologic exam was nonfocal. He was under control, carbamazepine level and vitamin D and CBC and metabolic panel were checked.  Tegretol level 01/2015 11.6  Review of Systems: Patient complains of symptoms per HPI as well as the following symptoms: Snoring, seizures, hearing loss, ringing in ears. Pertinent negatives and positives per HPI. All others negative.   Social History   Socioeconomic History  . Marital status: Married    Spouse name: Not on file  . Number of children: 2  . Years of education: Not on file  . Highest education level: Bachelor's degree (e.g., BA, AB, BS)  Occupational History    Comment: IT consultant  Social Needs  . Financial resource strain: Not on file  . Food insecurity:    Worry: Not on file    Inability: Not on file  . Transportation needs:    Medical: Not  on file    Non-medical: Not on file  Tobacco Use  . Smoking status: Never Smoker  . Smokeless tobacco: Never Used  Substance and Sexual Activity  . Alcohol use: Yes    Comment: Occasional  . Drug use: No  . Sexual activity: Yes  Lifestyle  . Physical activity:    Days per week: Not on file    Minutes per session: Not on file  . Stress: Not on file    Relationships  . Social connections:    Talks on phone: Not on file    Gets together: Not on file    Attends religious service: Not on file    Active member of club or organization: Not on file    Attends meetings of clubs or organizations: Not on file    Relationship status: Not on file  . Intimate partner violence:    Fear of current or ex partner: Not on file    Emotionally abused: Not on file    Physically abused: Not on file    Forced sexual activity: Not on file  Other Topics Concern  . Not on file  Social History Narrative   Lives at home with his wife   Right handed   Caffeine: 4 cups daily    Family History  Problem Relation Age of Onset  . Stroke Mother   . Heart attack Father        MI at age 53  . Heart disease Father   . Seizures Neg Hx     Past Medical History:  Diagnosis Date  . Blindness of right eye    childhood incident  . CAD (coronary artery disease)   . Hyperlipidemia   . Seizures (HCC)     Past Surgical History:  Procedure Laterality Date  . ANKLE SURGERY    . EYE SURGERY    . TONSILLECTOMY      Current Outpatient Medications  Medication Sig Dispense Refill  . aspirin 81 MG chewable tablet Chew 81 mg by mouth daily.    Marland Kitchen atorvastatin (LIPITOR) 40 MG tablet TAKE 1 TABLET BY MOUTH EVERY DAY 30 tablet 0  . carbamazepine (TEGRETOL XR) 200 MG 12 hr tablet Takes 400 mg in the morning and 600 mg at night 150 tablet 12  . Multiple Vitamin (MULTIVITAMIN WITH MINERALS) TABS Take 1 tablet by mouth daily.    Marland Kitchen neomycin-polymyxin b-dexamethasone (MAXITROL) 3.5-10000-0.1 OINT 1 application as needed. prn     No current facility-administered medications for this visit.     Allergies as of 01/21/2018 - Review Complete 01/21/2018  Allergen Reaction Noted  . Pravastatin  03/12/2012    Vitals: BP 125/81 (BP Location: Left Arm, Patient Position: Sitting)   Pulse 66   Ht 5\' 7"  (1.702 m)   Wt 155 lb (70.3 kg)   BMI 24.28 kg/m  Last Weight:  Wt  Readings from Last 1 Encounters:  01/21/18 155 lb (70.3 kg)   Last Height:   Ht Readings from Last 1 Encounters:  01/21/18 5\' 7"  (1.702 m)  Is Physical exam: Exam: Gen: NAD, conversant, well nourised,  well groomed                     CV: RRR, no MRG. No Carotid Bruits. No peripheral edema, warm, nontender Eyes: Conjunctivae clear without exudates or hemorrhage  Neuro: Detailed Neurologic Exam  Speech:    Speech is normal; fluent and spontaneous with normal comprehension.  Cognition:    The  patient is oriented to person, place, and time;     recent and remote memory intact;     language fluent;     normal attention, concentration,     fund of knowledge Cranial Nerves:    The pupils are equal, round, and reactive to light. The fundi are normal and spontaneous venous pulsations are present. Visual fields are full to finger confrontation in the left eye, right eye removed on Tegretol and after. Extraocular movements are intact. Trigeminal sensation is intact and the muscles of mastication are normal. The face is symmetric. The palate elevates in the midline. Hearing intact. Voice is normal. Shoulder shrug is normal. The tongue has normal motion without fasciculations.   Coordination:    Normal finger to nose and heel to shin. Normal rapid alternating movements.   Gait:    Heel-toe and tandem gait are normal.   Motor Observation:    No asymmetry, no atrophy, and no involuntary movements noted. Tone:    Normal muscle tone.    Posture:    Posture is normal. normal erect    Strength:    Strength is V/V in the upper and lower limbs.      Sensation: intact to LT     Reflex Exam:  DTR's:    Deep tendon reflexes in the upper and lower extremities are normal bilaterally.   Toes:    The toes are downgoing bilaterally.   Clonus:    Clonus is absent.     Assessment/Plan:  62 year old gentleman with partial seizures well-controlled on Tegretol  Continue Tegretol  Return  to pcp who can refill Tegretol yearly, patient stable for over 10 years  Calcium Supplementation Probably Helpful, But Take at a Different Time of Day Anticonvulsant drugs may impair calcium absorption and, in this way, increase the risk of osteoporosis and other bone disorders. Follow up with PCP for management and to discuss DEXA scan.  Discussed Patients with epilepsy have a small risk of sudden unexpected death, a condition referred to as sudden unexpected death in epilepsy (SUDEP). SUDEP is defined specifically as the sudden, unexpected, witnessed or unwitnessed, nontraumatic and nondrowning death in patients with epilepsy with or without evidence for a seizure, and excluding documented status epilepticus, in which post mortem examination does not reveal a structural or toxicologic cause for death    Naomie Dean, MD  [facility] [address] [place], [place] [address]  Phone [phone] Fax [phone]  A total of 15 minutes was spent face-to-face with this patient. Over half this time was spent on counseling patient on the  1. Partial idiopathic epilepsy with seizures of localized onset, not intractable, without status epilepticus (HCC)    diagnosis and different diagnostic and therapeutic options, counseling and coordination of care, risks ans benefits of management, compliance, or risk factor reduction and education.

## 2018-05-20 DIAGNOSIS — R05 Cough: Secondary | ICD-10-CM | POA: Diagnosis not present

## 2018-05-20 DIAGNOSIS — J069 Acute upper respiratory infection, unspecified: Secondary | ICD-10-CM | POA: Diagnosis not present

## 2018-05-20 DIAGNOSIS — R509 Fever, unspecified: Secondary | ICD-10-CM | POA: Diagnosis not present

## 2018-05-22 DIAGNOSIS — R509 Fever, unspecified: Secondary | ICD-10-CM | POA: Diagnosis not present

## 2018-05-22 DIAGNOSIS — J069 Acute upper respiratory infection, unspecified: Secondary | ICD-10-CM | POA: Diagnosis not present

## 2018-05-22 DIAGNOSIS — G40909 Epilepsy, unspecified, not intractable, without status epilepticus: Secondary | ICD-10-CM | POA: Diagnosis not present

## 2018-05-22 DIAGNOSIS — R05 Cough: Secondary | ICD-10-CM | POA: Diagnosis not present

## 2018-06-01 DIAGNOSIS — H10012 Acute follicular conjunctivitis, left eye: Secondary | ICD-10-CM | POA: Diagnosis not present

## 2018-06-16 DIAGNOSIS — R0789 Other chest pain: Secondary | ICD-10-CM | POA: Diagnosis not present

## 2018-06-16 DIAGNOSIS — R05 Cough: Secondary | ICD-10-CM | POA: Diagnosis not present

## 2018-06-16 DIAGNOSIS — J069 Acute upper respiratory infection, unspecified: Secondary | ICD-10-CM | POA: Diagnosis not present

## 2018-06-16 DIAGNOSIS — R5383 Other fatigue: Secondary | ICD-10-CM | POA: Diagnosis not present

## 2018-06-17 DIAGNOSIS — G40909 Epilepsy, unspecified, not intractable, without status epilepticus: Secondary | ICD-10-CM | POA: Diagnosis not present

## 2018-06-17 DIAGNOSIS — R0789 Other chest pain: Secondary | ICD-10-CM | POA: Diagnosis not present

## 2018-09-02 DIAGNOSIS — Z03818 Encounter for observation for suspected exposure to other biological agents ruled out: Secondary | ICD-10-CM | POA: Diagnosis not present

## 2018-11-18 DIAGNOSIS — Z0000 Encounter for general adult medical examination without abnormal findings: Secondary | ICD-10-CM | POA: Diagnosis not present

## 2018-11-18 DIAGNOSIS — G40909 Epilepsy, unspecified, not intractable, without status epilepticus: Secondary | ICD-10-CM | POA: Diagnosis not present

## 2018-11-18 DIAGNOSIS — Z125 Encounter for screening for malignant neoplasm of prostate: Secondary | ICD-10-CM | POA: Diagnosis not present

## 2018-11-18 DIAGNOSIS — E7800 Pure hypercholesterolemia, unspecified: Secondary | ICD-10-CM | POA: Diagnosis not present

## 2018-11-18 DIAGNOSIS — R82998 Other abnormal findings in urine: Secondary | ICD-10-CM | POA: Diagnosis not present

## 2018-11-30 DIAGNOSIS — Z8249 Family history of ischemic heart disease and other diseases of the circulatory system: Secondary | ICD-10-CM | POA: Diagnosis not present

## 2018-11-30 DIAGNOSIS — H54415A Blindness right eye category 5, normal vision left eye: Secondary | ICD-10-CM | POA: Diagnosis not present

## 2018-11-30 DIAGNOSIS — Z1331 Encounter for screening for depression: Secondary | ICD-10-CM | POA: Diagnosis not present

## 2018-11-30 DIAGNOSIS — G40909 Epilepsy, unspecified, not intractable, without status epilepticus: Secondary | ICD-10-CM | POA: Diagnosis not present

## 2018-11-30 DIAGNOSIS — I2510 Atherosclerotic heart disease of native coronary artery without angina pectoris: Secondary | ICD-10-CM | POA: Diagnosis not present

## 2018-11-30 DIAGNOSIS — Z0000 Encounter for general adult medical examination without abnormal findings: Secondary | ICD-10-CM | POA: Diagnosis not present

## 2018-12-24 DIAGNOSIS — M25562 Pain in left knee: Secondary | ICD-10-CM | POA: Diagnosis not present

## 2019-01-19 DIAGNOSIS — Z1159 Encounter for screening for other viral diseases: Secondary | ICD-10-CM | POA: Diagnosis not present

## 2019-01-22 DIAGNOSIS — Z1211 Encounter for screening for malignant neoplasm of colon: Secondary | ICD-10-CM | POA: Diagnosis not present

## 2019-01-28 DIAGNOSIS — R52 Pain, unspecified: Secondary | ICD-10-CM | POA: Diagnosis not present

## 2019-02-13 ENCOUNTER — Telehealth: Payer: Self-pay | Admitting: Neurology

## 2019-02-13 DIAGNOSIS — G40009 Localization-related (focal) (partial) idiopathic epilepsy and epileptic syndromes with seizures of localized onset, not intractable, without status epilepticus: Secondary | ICD-10-CM

## 2019-02-15 NOTE — Telephone Encounter (Signed)
Called pt and LVM (ok per DPR) advising per last office note from 1 year ago, pt was released to PCP since stable and doing well. I informed pt that we received a refill request for Carbamazepine and advised we can send 1 month refill to pharmacy until he can have primary refill further. Left office number in message.

## 2019-02-16 NOTE — Telephone Encounter (Signed)
He can see amy thanks

## 2019-02-16 NOTE — Telephone Encounter (Signed)
Pt has called and RN Bethany;s message was relayed to him.  Pt states he spoke with his primary who is not comfortable is filling the Carbamazepine .  Pt states his primary suggested Dr Jaynee Eagles manage the script for the Carbamazepine.  Please call

## 2019-02-16 NOTE — Telephone Encounter (Signed)
Spoke with pt and advised ok to continue seeing yearly and refill carbamazepine. Pt scheduled with Amy NP on Monday 03/01/2019 @ 7:30 AM arrival 15-30 minutes early. He understands to bring a mask and understands he will be screened at the door per COVID19 protocol.

## 2019-02-28 NOTE — Progress Notes (Addendum)
PATIENT: Kenneth Olson DOB: Jul 01, 1955  REASON FOR VISIT: follow up HISTORY FROM: patient  Chief Complaint  Patient presents with  . Follow-up    Yearly f/u. Alone. Rm 9. No new concerns at this time.      HISTORY OF PRESENT ILLNESS: Today 03/01/19 Kenneth Olson is a 63 y.o. male here today for follow up for seizure. He continues Tegretol 400mg  in am and 600mg  in pm. Last seizure over 10 years ago. He is feeling well and without complaints. He exercises regularly. He is followed by PCP for HLD on atorvastatin. He has labs last in 10/2018 reviewed in office today. Na WNL.    HISTORY: (copied from Dr note on 01/21/2018)  Interval History 10/22019: Extremely well controlled, compliant, no seizures, no side effects. Discussed epilepsy, he has been seizure free for over 10 years, takes medications never misses, no side effects. He sees his pcp regularly. Continue AEDs will release back to pcp and have them refill med every year.   HPI:  Kenneth Olson is a 63 y.o. male here as a referral from Dr. Anne Olson for seizures. Past medical history of partial seizures, syncope. Partial seizures without and with generalization. Partial seizures started in his 56s, thought he was passing out, he had a complete workup and no etiology found. Once he was placed on Tegretol he had no more episodes. Last episode was 10 years ago. He knows when the seizures are coming, feels lightheaded like he is going to pass out, he has time to pull over in the car or sit down. He loses consciousness and then they generalize with bilateral shaking, last a few minutes, denies post-ictal confusion but always vomits, is tired afterwards. No tongue biting or urinating. No head trauma or inciting event. No Fhx of seizures.  Has had DEXA  And was fine. No other focal neurologic deficits, associated symptoms, inciting events or modifiable factors.    Reviewed notes, labs and imaging from outside physicians, which  showed:  MRI 01/2008:  Findings: There is no evidence for acute infarction, intracranial hemorrhage, mass lesion, hydrocephalus, or extra-axial fluid. There may be mild atrophy. There does appear to be mild small vessel disease. 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 There is mild chronic sinus disease. The calvarium is intact. Pituitary and cerebellar tonsils are unremarkable.  Post infusion, there is no abnormal enhancement of the brain or meninges. The major intracranial vascular structures appear widely Patent.  EEG 2011  IMPRESSION: Prosthetic globe unremarkable, right orbit.  No abnormal intracranial enhancement and no acute intracranial Findings.  Reviewed [facility] from the [facility] in [place] Dr.[name], patient was last seen in October 2016 diagnosed with partial epilepsy. Previous to that he was seen in 2012. He is on Tegretol 200 mg 2 pills in the morning and 3 pills at night. Had dizziness on higher doses. He reported no seizures or fainting. He has a past medical history of dizziness. Higher doses of Carbatrol made him dizzy. No additional problems except fatiguing. He was started on cholesterol several years prior. Exercising 3 times per week and no medical changes. Neurologic exam was nonfocal. He was under control, carbamazepine level and vitamin D and CBC and metabolic panel were checked.  Tegretol level 01/2015 11.6  note on 01/21/2018)  REVIEW OF SYSTEMS: Out of a complete 14 system review of symptoms, the patient complains only of the following symptoms, weakness and dizziness when stressed and all other reviewed systems are negative.  ALLERGIES: Allergies  Allergen Reactions  . Pravastatin     Dizziness; acne    HOME MEDICATIONS: Outpatient Medications Prior to Visit  Medication Sig Dispense Refill  . aspirin 81 MG chewable tablet Chew 81 mg by mouth daily.    Marland Kitchen atorvastatin (LIPITOR) 40 MG tablet TAKE 1 TABLET BY MOUTH EVERY DAY  30 tablet 0  . neomycin-polymyxin b-dexamethasone (MAXITROL) 7.8-93810-1.7 OINT 1 application as needed. prn    . carbamazepine (TEGRETOL XR) 200 MG 12 hr tablet TAKES 400 MG IN THE MORNING AND 600 MG AT NIGHT 150 tablet 0  . Multiple Vitamin (MULTIVITAMIN WITH MINERALS) TABS Take 1 tablet by mouth daily.     No facility-administered medications prior to visit.     PAST MEDICAL HISTORY: Past Medical History:  Diagnosis Date  . Blindness of right eye    childhood incident  . CAD (coronary artery disease)   . Hyperlipidemia   . Seizures ([place])     PAST SURGICAL HISTORY: Past Surgical History:  Procedure Laterality Date  . ANKLE SURGERY    . EYE SURGERY    . TONSILLECTOMY      FAMILY HISTORY: Family History  Problem Relation Age of Onset  . Stroke Mother   . Heart attack Father        MI at age 58  . Heart disease Father   . Seizures Neg Hx     SOCIAL HISTORY: Social History   Socioeconomic History  . Marital status: Married    Spouse name: Not on file  . Number of children: 2  . Years of education: Not on file  . Highest education level: Bachelor's degree (e.g., BA, AB, BS)  Occupational History    Comment: IT consultant  Social Needs  . Financial resource strain: Not on file  . Food insecurity    Worry: Not on file    Inability: Not on file  . Transportation needs    Medical: Not on file    Non-medical: Not on file  Tobacco Use  . Smoking status: Never Smoker  . Smokeless tobacco: Never Used  Substance and Sexual Activity  . Alcohol use: Yes    Comment: Occasional  . Drug use: No  . Sexual activity: Yes  Lifestyle  . Physical activity    Days per week: Not on file    Minutes per session: Not on file  . Stress: Not on file  Relationships  . Social Herbalist on phone: Not on file    Gets together: Not on file    Attends religious service: Not on file    Active member of club or organization: Not on file    Attends meetings of clubs or  organizations: Not on file    Relationship status: Not on file  . Intimate partner violence    Fear of current or ex partner: Not on file    Emotionally abused: Not on file    Physically abused: Not on file    Forced sexual activity: Not on file  Other Topics Concern  . Not on file  Social History Narrative   Lives at home with his wife   Right handed   Caffeine: 4 cups daily      PHYSICAL EXAM  Vitals:   03/01/19 0730  BP: 125/78  Pulse: 70  Temp: 97.9 F (36.6 C)  TempSrc: Oral  Weight: 156 lb (70.8 kg)  Height: 5\' 7"  (1.702 m)   Body mass index is 24.43  kg/m.  Generalized: Well developed, in no acute distress  Cardiology: normal rate and rhythm, no murmur noted Neurological examination  Mentation: Alert oriented to time, place, history taking. Follows all commands speech and language fluent Cranial nerve II-XII: Pupils were equal round reactive to light. Extraocular movements were full, visual field were full on confrontational test. Facial sensation and strength were normal. Uvula tongue midline. Head turning and shoulder shrug  were normal and symmetric. Motor: The motor testing reveals 5 over 5 strength of all 4 extremities. Good symmetric motor tone is noted throughout.  Sensory: Sensory testing is intact to soft touch on all 4 extremities. No evidence of extinction is noted.  Coordination: Cerebellar testing reveals good finger-nose-finger and heel-to-shin bilaterally.  Gait and station: Gait is normal.   DIAGNOSTIC DATA (LABS, IMAGING, TESTING) - I reviewed patient records, labs, notes, testing and imaging myself where available.  No flowsheet data found.   Lab Results  Component Value Date   WBC 6.8 12/14/2011   HGB 13.7 12/14/2011   HCT 39.9 12/14/2011   MCV 95.5 12/14/2011   PLT 228 12/14/2011      Component Value Date/Time   NA 137 12/14/2011 1812   K 3.9 12/14/2011 1812   CL 103 12/14/2011 1812   CO2 23 12/14/2011 1812   GLUCOSE 87 12/14/2011  1812   BUN 17 12/14/2011 1812   CREATININE 1.09 12/14/2011 1812   CALCIUM 8.8 12/14/2011 1812   PROT 6.8 03/12/2012 0815   ALBUMIN 4.3 03/12/2012 0815   AST 20 03/12/2012 0815   ALT 25 03/12/2012 0815   ALKPHOS 66 03/12/2012 0815   BILITOT 0.6 03/12/2012 0815   GFRNONAA 76 (L) 12/14/2011 1812   GFRAA 88 (L) 12/14/2011 1812   Lab Results  Component Value Date   CHOL 212 (H) 03/12/2012   HDL 71.70 03/12/2012   LDLDIRECT 121.2 03/12/2012   TRIG 107.0 03/12/2012   CHOLHDL 3 03/12/2012   No results found for: HGBA1C No results found for: VITAMINB12 No results found for: TSH     ASSESSMENT AND PLAN 63 y.o. year old male  has a past medical history of Blindness of right eye, CAD (coronary artery disease), Hyperlipidemia, and Seizures (HCC). here with     ICD-10-CM   1. Partial idiopathic epilepsy with seizures of localized onset, not intractable, without status epilepticus (HCC)  G40.009 carbamazepine (TEGRETOL XR) 200 MG 12 hr tablet    Arthur is doing very well.  He is tolerating carbamazepine with no obvious adverse effects. Labs with PCP were normal. We have requested copies for chart. He will continue carbamazepine  in am and  in pm. He will continue close follow-up with PCP for annual wellness and management of cholesterol.  Regular exercise and well-balanced diet advised.  He will follow-up with me in 1 year, sooner if needed.  He verbalizes understanding and agreement with this plan.    No orders of the defined types were placed in this encounter.    Meds ordered this encounter  Medications  . carbamazepine (TEGRETOL XR) 200 MG 12 hr tablet    Sig: Take 2 tablets ( ) in the am and 3 tablets ( ) in the pm    Dispense:  150 tablet    Refill:  11    Order Specific Question:   Supervising Provider    Answer:   Anson Fret [[id number]]      I spent 15 minutes with the patient. 50% of this time was spent counseling and educating  patient on plan of care  and medications.    Shawnie Dappermy Karen Kinnard, FNP-C 03/01/2019, 8:01 AM [facility] [address], [address] [place], [place] [address] [phone]  Made any corrections needed, and agree with history, physical, neuro exam,assessment and plan as stated.     Naomie DeanAntonia Ahern, MD [facility]

## 2019-02-28 NOTE — Patient Instructions (Signed)
Continue Tegretol 400mg  in am and 600mg  in pm  Follow up in 1 year, sooner if needed   Seizure, Adult A seizure is a sudden burst of abnormal electrical activity in the brain. Seizures usually last from 30 seconds to 2 minutes. They can cause many different symptoms. Usually, seizures are not harmful unless they last a long time. What are the causes? Common causes of this condition include:  Fever or infection.  Conditions that affect the brain, such as: ? A brain abnormality that you were born with. ? A brain or head injury. ? Bleeding in the brain. ? A tumor. ? Stroke. ? Brain disorders such as autism or cerebral palsy.  Low blood sugar.  Conditions that are passed from parent to child (are inherited).  Problems with substances, such as: ? Having a reaction to a drug or a medicine. ? Suddenly stopping the use of a substance (withdrawal). In some cases, the cause may not be known. A person who has repeated seizures over time without a clear cause has a condition called epilepsy. What increases the risk? You are more likely to get this condition if you have:  A family history of epilepsy.  Had a seizure in the past.  A brain disorder.  A history of head injury, lack of oxygen at birth, or strokes. What are the signs or symptoms? There are many types of seizures. The symptoms vary depending on the type of seizure you have. Examples of symptoms during a seizure include:  Shaking (convulsions).  Stiffness in the body.  Passing out (losing consciousness).  Head nodding.  Staring.  Not responding to sound or touch.  Loss of bladder control and bowel control. Some people have symptoms right before and right after a seizure happens. Symptoms before a seizure may include:  Fear.  Worry (anxiety).  Feeling like you may vomit (nauseous).  Feeling like the room is spinning (vertigo).  Feeling like you saw or heard something before (dj vu).  Odd tastes or  smells.  Changes in how you see. You may see flashing lights or spots. Symptoms after a seizure happens can include:  Confusion.  Sleepiness.  Headache.  Weakness on one side of the body. How is this treated? Most seizures will stop on their own in under 5 minutes. In these cases, no treatment is needed. Seizures that last longer than 5 minutes will usually need treatment. Treatment can include:  Medicines given through an IV tube.  Avoiding things that are known to cause your seizures. These can include medicines that you take for another condition.  Medicines to treat epilepsy.  Surgery to stop the seizures. This may be needed if medicines do not help. Follow these instructions at home: Medicines  Take over-the-counter and prescription medicines only as told by your doctor.  Do not eat or drink anything that may keep your medicine from working, such as alcohol. Activity  Do not do any activities that would be dangerous if you had another seizure, like driving or swimming. Wait until your doctor says it is safe for you to do them.  If you live in the [place] ask your local DMV (department of motor vehicles) when you can drive.  Get plenty of rest. Teaching others Teach friends and family what to do when you have a seizure. They should:  Lay you on the ground.  Protect your head and body.  Loosen any tight clothing around your neck.  Turn you on your side.  Not hold  you down.  Not put anything into your mouth.  Know whether or not you need emergency care.  Stay with you until you are better.  General instructions  Contact your doctor each time you have a seizure.  Avoid anything that gives you seizures.  Keep a seizure diary. Write down: ? What you think caused each seizure. ? What you remember about each seizure.  Keep all follow-up visits as told by your doctor. This is important. Contact a doctor if:  You have another seizure.  You have seizures  more often.  There is any change in what happens during your seizures.  You keep having seizures with treatment.  You have symptoms of being sick or having an infection. Get help right away if:  You have a seizure that: ? Lasts longer than 5 minutes. ? Is different than seizures you had before. ? Makes it harder to breathe. ? Happens after you hurt your head.  You have any of these symptoms after a seizure: ? Not being able to speak. ? Not being able to use a part of your body. ? Confusion. ? A bad headache.  You have two or more seizures in a row.  You do not wake up right after a seizure.  You get hurt during a seizure. These symptoms may be an emergency. Do not wait to see if the symptoms will go away. Get medical help right away. Call your local emergency services (911 in the [place] Do not drive yourself to the hospital. Summary  Seizures usually last from 30 seconds to 2 minutes. Usually, they are not harmful unless they last a long time.  Do not eat or drink anything that may keep your medicine from working, such as alcohol.  Teach friends and family what to do when you have a seizure.  Contact your doctor each time you have a seizure. This information is not intended to replace advice given to you by your health care provider. Make sure you discuss any questions you have with your health care provider. Document Released: 09/25/2007 Document Revised: 06/26/2018 Document Reviewed: 06/26/2018 Elsevier Patient Education  2020 [employer].

## 2019-03-01 ENCOUNTER — Ambulatory Visit (INDEPENDENT_AMBULATORY_CARE_PROVIDER_SITE_OTHER): Payer: PRIVATE HEALTH INSURANCE | Admitting: Family Medicine

## 2019-03-01 ENCOUNTER — Encounter: Payer: Self-pay | Admitting: Family Medicine

## 2019-03-01 ENCOUNTER — Other Ambulatory Visit: Payer: Self-pay

## 2019-03-01 VITALS — BP 125/78 | HR 70 | Temp 97.9°F | Ht 67.0 in | Wt 156.0 lb

## 2019-03-01 DIAGNOSIS — G40009 Localization-related (focal) (partial) idiopathic epilepsy and epileptic syndromes with seizures of localized onset, not intractable, without status epilepticus: Secondary | ICD-10-CM

## 2019-03-01 MED ORDER — CARBAMAZEPINE ER 200 MG PO TB12: ORAL_TABLET | ORAL | 11 refills | Status: DC

## 2019-05-14 DIAGNOSIS — Z4420 Encounter for fitting and adjustment of artificial eye, unspecified: Secondary | ICD-10-CM | POA: Diagnosis not present

## 2019-07-05 DIAGNOSIS — M25562 Pain in left knee: Secondary | ICD-10-CM | POA: Diagnosis not present

## 2019-07-06 ENCOUNTER — Other Ambulatory Visit: Payer: Self-pay | Admitting: Orthopedic Surgery

## 2019-07-06 DIAGNOSIS — M25562 Pain in left knee: Secondary | ICD-10-CM

## 2019-07-27 ENCOUNTER — Ambulatory Visit
Admission: RE | Admit: 2019-07-27 | Discharge: 2019-07-27 | Disposition: A | Discharge status: Home / Self Care | Payer: BC Managed Care – PPO | Source: Ambulatory Visit | Attending: Orthopedic Surgery | Admitting: Orthopedic Surgery

## 2019-07-27 ENCOUNTER — Other Ambulatory Visit: Payer: Self-pay

## 2019-07-27 DIAGNOSIS — M25562 Pain in left knee: Secondary | ICD-10-CM

## 2019-08-02 DIAGNOSIS — S83242A Other tear of medial meniscus, current injury, left knee, initial encounter: Secondary | ICD-10-CM | POA: Diagnosis not present

## 2019-08-04 ENCOUNTER — Telehealth: Payer: Self-pay | Admitting: *Deleted

## 2019-08-04 NOTE — Telephone Encounter (Signed)
Faxed reply to [facility] re: surgical clearance form for left knee arthroscopy.

## 2019-09-01 DIAGNOSIS — M94262 Chondromalacia, left knee: Secondary | ICD-10-CM | POA: Diagnosis not present

## 2019-09-01 DIAGNOSIS — G8918 Other acute postprocedural pain: Secondary | ICD-10-CM | POA: Diagnosis not present

## 2019-09-01 DIAGNOSIS — M6752 Plica syndrome, left knee: Secondary | ICD-10-CM | POA: Diagnosis not present

## 2019-09-01 DIAGNOSIS — M23222 Derangement of posterior horn of medial meniscus due to old tear or injury, left knee: Secondary | ICD-10-CM | POA: Diagnosis not present

## 2019-09-02 DIAGNOSIS — G8918 Other acute postprocedural pain: Secondary | ICD-10-CM | POA: Diagnosis not present

## 2019-10-11 ENCOUNTER — Emergency Department (HOSPITAL_COMMUNITY)
Admission: EM | Admit: 2019-10-11 | Discharge: 2019-10-11 | Disposition: A | Discharge status: Home / Self Care | Payer: BC Managed Care – PPO | Attending: Emergency Medicine | Admitting: Emergency Medicine

## 2019-10-11 ENCOUNTER — Encounter (HOSPITAL_COMMUNITY): Payer: Self-pay | Admitting: Emergency Medicine

## 2019-10-11 ENCOUNTER — Emergency Department (HOSPITAL_COMMUNITY): Payer: BC Managed Care – PPO

## 2019-10-11 DIAGNOSIS — I2510 Atherosclerotic heart disease of native coronary artery without angina pectoris: Secondary | ICD-10-CM | POA: Insufficient documentation

## 2019-10-11 DIAGNOSIS — R569 Unspecified convulsions: Secondary | ICD-10-CM

## 2019-10-11 DIAGNOSIS — R1031 Right lower quadrant pain: Secondary | ICD-10-CM | POA: Insufficient documentation

## 2019-10-11 DIAGNOSIS — G40909 Epilepsy, unspecified, not intractable, without status epilepticus: Secondary | ICD-10-CM | POA: Diagnosis not present

## 2019-10-11 DIAGNOSIS — Z79899 Other long term (current) drug therapy: Secondary | ICD-10-CM | POA: Insufficient documentation

## 2019-10-11 DIAGNOSIS — Z7982 Long term (current) use of aspirin: Secondary | ICD-10-CM | POA: Insufficient documentation

## 2019-10-11 LAB — COMPREHENSIVE METABOLIC PANEL
ALT: 36 U/L (ref 0–44)
AST: 28 U/L (ref 15–41)
Albumin: 4 g/dL (ref 3.5–5.0)
Alkaline Phosphatase: 66 U/L (ref 38–126)
Anion gap: 8 (ref 5–15)
BUN: 14 mg/dL (ref 8–23)
CO2: 26 mmol/L (ref 22–32)
Calcium: 8.9 mg/dL (ref 8.9–10.3)
Chloride: 105 mmol/L (ref 98–111)
Creatinine, Ser: 1.19 mg/dL (ref 0.61–1.24)
GFR calc Af Amer: >60 mL/min (ref >60)
GFR calc non Af Amer: >60 mL/min (ref >60)
Glucose, Bld: 113 mg/dL — ABNORMAL HIGH (ref 70–99)
Potassium: 4.9 mmol/L (ref 3.5–5.1)
Sodium: 139 mmol/L (ref 135–145)
Total Bilirubin: 0.6 mg/dL (ref 0.3–1.2)
Total Protein: 6.1 g/dL — ABNORMAL LOW (ref 6.5–8.1)

## 2019-10-11 LAB — CBC
HCT: 45.7 % (ref 39.0–52.0)
Hemoglobin: 15 g/dL (ref 13.0–17.0)
MCH: 33.3 pg (ref 26.0–34.0)
MCHC: 32.8 g/dL (ref 30.0–36.0)
MCV: 101.3 fL — ABNORMAL HIGH (ref 80.0–100.0)
Platelets: 288 10*3/uL (ref 150–400)
RBC: 4.51 MIL/uL (ref 4.22–5.81)
RDW: 12.8 % (ref 11.5–15.5)
WBC: 11.4 10*3/uL — ABNORMAL HIGH (ref 4.0–10.5)
nRBC: 0 % (ref 0.0–0.2)

## 2019-10-11 LAB — URINALYSIS, ROUTINE W REFLEX MICROSCOPIC
Bilirubin Urine: NEGATIVE
Glucose, UA: NEGATIVE mg/dL
Hgb urine dipstick: NEGATIVE
Ketones, ur: NEGATIVE mg/dL
Leukocytes,Ua: NEGATIVE
Nitrite: NEGATIVE
Protein, ur: NEGATIVE mg/dL
Specific Gravity, Urine: 1.014 (ref 1.005–1.030)
pH: 7 (ref 5.0–8.0)

## 2019-10-11 LAB — LIPASE, BLOOD: Lipase: 29 U/L (ref 11–51)

## 2019-10-11 MED ORDER — DICYCLOMINE HCL 20 MG PO TABS
20.0000 mg | ORAL_TABLET | Freq: Two times a day (BID) | ORAL | 0 refills | Status: DC
Start: 2019-10-11 — End: 2020-09-11

## 2019-10-11 MED ORDER — SODIUM CHLORIDE 0.9 % IV BOLUS
1000.0000 mL | Freq: Once | INTRAVENOUS | Status: AC
  Administered 2019-10-11: 1000 mL via INTRAVENOUS

## 2019-10-11 MED ORDER — SODIUM CHLORIDE 0.9% FLUSH: 3.0000 mL | Freq: Once | INTRAVENOUS | Status: DC

## 2019-10-11 MED ORDER — IOHEXOL 300 MG/ML  SOLN
100.0000 mL | Freq: Once | INTRAMUSCULAR | Status: AC | PRN
  Administered 2019-10-11: 100 mL via INTRAVENOUS

## 2019-10-11 NOTE — ED Notes (Signed)
Patient verbalizes understanding of discharge instructions . Opportunity for questions and answers were provided . Armband removed by staff ,Pt discharged from ED. W/C  offered at D/C  and Declined W/C at D/C and was escorted to lobby by RN.  

## 2019-10-11 NOTE — ED Triage Notes (Signed)
Pt. Stateed, I started having stomach pain last night  And it continues this morning. Called my Dr. And he said tp come here.

## 2019-10-11 NOTE — ED Provider Notes (Signed)
[facility] EMERGENCY DEPARTMENT Provider Note   CSN: [id number] Arrival date & time: 10/11/19  4098     History Chief Complaint  Patient presents with  . Abdominal Pain    Kenneth Olson is a 64 y.o. male.  HPI Patient with history of seizures reports onset of RLQ abdominal pain last evening. Not relieved after bowel movement and continued through the night. He had some nausea during the night and pain continued this morning. He did not have any vomiting. He presented to the ED for evaluation and while he was collecting a urine sample he became diaphoretic and felt that he was about to have a seizure. He was witnessed to have about a 30second seizure-like episode by EMT but was promptly back to baseline without a post-ictal phase. He reports his seizures are typically well controlled and he has not had one in about 15 years (although he reports that seizure was also precipitated by a painful event).     Past Medical History:  Diagnosis Date  . Blindness of right eye    childhood incident  . CAD (coronary artery disease)   . Hyperlipidemia   . Seizures [facility])     Patient Active Problem List   Diagnosis Date Noted  . Partial idiopathic epilepsy with seizures of localized onset, not intractable, without status epilepticus (HCC) 01/21/2018  . Hyperlipidemia 01/13/2012  . CAD (coronary artery disease) of artery bypass graft 01/13/2012    Past Surgical History:  Procedure Laterality Date  . ANKLE SURGERY    . EYE SURGERY    . TONSILLECTOMY         Family History  Problem Relation Age of Onset  . Stroke Mother   . Heart attack Father        MI at age 63  . Heart disease Father   . Seizures Neg Hx     Social History   Tobacco Use  . Smoking status: Never Smoker  . Smokeless tobacco: Never Used  Vaping Use  . Vaping Use: Never used  Substance Use Topics  . Alcohol use: Yes    Comment: Occasional  . Drug use: No    Home Medications Prior to  Admission medications   Medication Sig Start Date End Date Taking? Authorizing Provider  aspirin 81 MG chewable tablet Chew 81 mg by mouth daily.   Yes [provider]  atorvastatin (LIPITOR) 40 MG tablet TAKE 1 TABLET BY MOUTH EVERY DAY Patient taking differently: Take 40 mg by mouth daily.  05/18/13  Yes Lewayne Bunting, MD  carbamazepine (TEGRETOL XR) 200 MG 12 hr tablet Take 2 tablets (40mg ) in the am and 3 tablets (600mg ) in the pm Patient taking differently: Take 200 mg by mouth See admin instructions. Take 2 tablets (400mg ) in the am and 3 tablets (600mg ) in the pm 03/01/19  Yes Lomax, Amy, NP  dicyclomine (BENTYL) 20 MG tablet Take 1 tablet (20 mg total) by mouth 2 (two) times daily. 10/11/19   , MD    Allergies    Pravastatin  Review of Systems   Review of Systems A comprehensive review of systems was completed and negative except as noted in HPI.   Physical Exam Updated Vital Signs BP 113/64 (BP Location: Right Arm)   Pulse 72   Temp 99.3 F (37.4 C) (Oral)   Resp 15   Ht 5\' 7"  (1.702 m)   Wt 68 kg   SpO2 98%   BMI 23.49  kg/m   Physical Exam Vitals and nursing note reviewed.  Constitutional:      Appearance: Normal appearance.  HENT:     Head: Normocephalic and atraumatic.     Nose: Nose normal.     Mouth/Throat:     Mouth: Mucous membranes are moist.  Eyes:     Extraocular Movements: Extraocular movements intact.     Conjunctiva/sclera: Conjunctivae normal.  Cardiovascular:     Rate and Rhythm: Normal rate.  Pulmonary:     Effort: Pulmonary effort is normal.     Breath sounds: Normal breath sounds.  Abdominal:     General: Abdomen is flat.     Palpations: Abdomen is soft.     Tenderness: There is abdominal tenderness in the right lower quadrant. There is guarding. Positive signs include McBurney's sign. Negative signs include Murphy's sign.  Musculoskeletal:        General: No swelling. Normal range of motion.     Cervical  back: Neck supple.  Skin:    General: Skin is warm and dry.  Neurological:     General: No focal deficit present.     Mental Status: He is alert.  Psychiatric:        Mood and Affect: Mood normal.     ED Results / Procedures / Treatments   Labs (all labs ordered are listed, but only abnormal results are displayed) Labs Reviewed  COMPREHENSIVE METABOLIC PANEL - Abnormal; Notable for the following components:      Result Value   Glucose, Bld 113 (*)    Total Protein 6.1 (*)    All other components within normal limits  CBC - Abnormal; Notable for the following components:   WBC 11.4 (*)    MCV 101.3 (*)    All other components within normal limits  LIPASE, BLOOD  URINALYSIS, ROUTINE W REFLEX MICROSCOPIC    EKG None  Radiology CT Abdomen Pelvis W Contrast  Result Date: 10/11/2019 CLINICAL DATA:  Onset right lower quadrant pain yesterday. EXAM: CT ABDOMEN AND PELVIS WITH CONTRAST TECHNIQUE: Multidetector CT imaging of the abdomen and pelvis was performed using the standard protocol following bolus administration of intravenous contrast. CONTRAST:  100 mL OMNIPAQUE IOHEXOL 300 MG/ML  SOLN COMPARISON:  None. FINDINGS: Lower chest: Lung bases clear.  No pleural or pericardial effusion. Hepatobiliary: No focal liver abnormality is seen. No gallstones, gallbladder wall thickening, or biliary dilatation. Pancreas: Unremarkable. No pancreatic ductal dilatation or surrounding inflammatory changes. Spleen: Normal in size without focal abnormality. Adrenals/Urinary Tract: Adrenal glands are unremarkable. Kidneys are normal, without renal calculi, focal lesion, or hydronephrosis. Bladder is unremarkable. Stomach/Bowel: Stomach is within normal limits. Appendix appears normal. No evidence of bowel wall thickening, distention, or inflammatory changes. Vascular/Lymphatic: No significant vascular findings are present. No enlarged abdominal or pelvic lymph nodes. Reproductive: Prostate is unremarkable.  Other: None. Musculoskeletal: No acute or focal abnormality. Mild S-shaped scoliosis noted IMPRESSION: Negative for appendicitis.  No acute abnormality abdomen or pelvis. Mild S-shaped scoliosis. Electronically Signed   By: Drusilla Kanner M.D.   On: 10/11/2019 13:43    Procedures Procedures (including critical care time)  Medications Ordered in ED Medications  sodium chloride flush (NS) 0.9 % injection 3 mL (3 mLs Intravenous Not Given 10/11/19 1105)  sodium chloride 0.9 % bolus 1,000 mL (0 mLs Intravenous Stopped 10/11/19 1311)  iohexol (OMNIPAQUE) 300 MG/ML solution 100 mL (100 mLs Intravenous Contrast Given 10/11/19 1316)    ED Course  I have reviewed the triage vital signs  and the nursing notes.  Pertinent labs & imaging results that were available during my care of the patient were reviewed by me and considered in my medical decision making (see chart for details).  Clinical Course as of Oct 10 1405  Mon Oct 11, 2019  1037 CBC shows mildly elevated WBC. UA is normal.    [CS]  1359 CMP normal. CT neg for appendicitis   [CS]  4503 CT images reviewed. Patient resting comfortably on reassessment. Still having some pain but no distress and no further seizure like episodes. He has not vomited in the ED. He is comfortable with discharge, will give Rx for Bentyl for his residual pain. Recommend PCP followup. RTED for any worsening pain, fever, vomiting or blood in stool.    [CS]    Clinical Course User Index [CS] Truddie Hidden, MD   MDM Rules/Calculators/A&P                          Patient with RLQ pain and exam concerning for appendicitis. Will check labs and send for CT. He had a seizure-like episode in the waiting room but immediately awake and alert afterwards. Will continue to monitor. He declines pain or nausea medications at the time of my initial evaluation.   Final Clinical Impression(s) / ED Diagnoses Final diagnoses:  Right lower quadrant abdominal pain  Seizure-like  activity ([place])    Rx / DC Orders ED Discharge Orders         Ordered    dicyclomine (BENTYL) 20 MG tablet  2 times daily     Discontinue  Reprint     10/11/19 1407           Truddie Hidden, MD 10/11/19 1407

## 2019-10-11 NOTE — ED Notes (Addendum)
Pt went to the bathroom and came back with a urine sample and stated he felt like he was going to have a seizure. Had Pt sit in chair beside me and checked his vitals. Pt stated his pain came back when he was in the bathroom and he was hurting really bad now. Pt also stated again he felt like he was going to have a seizure. Charge Notified and on the way. Pt did get pale and 1st pressure was 68/28, Pulse 40, O2 99%. Pt started to shake, with snoring respirations. Seizure like activity only lasted about 30 seconds and Pt was talking as soon as seizure like activity stopped. Stating I know I just had a seizure but I'm back now. Pt's Blood Pressure checked again, 132/82. Pt's color still pale and diaphoretic.

## 2019-10-18 DIAGNOSIS — R1031 Right lower quadrant pain: Secondary | ICD-10-CM | POA: Diagnosis not present

## 2019-11-12 DIAGNOSIS — Z4420 Encounter for fitting and adjustment of artificial eye, unspecified: Secondary | ICD-10-CM | POA: Diagnosis not present

## 2019-11-15 DIAGNOSIS — R3121 Asymptomatic microscopic hematuria: Secondary | ICD-10-CM | POA: Diagnosis not present

## 2019-11-15 DIAGNOSIS — R1030 Lower abdominal pain, unspecified: Secondary | ICD-10-CM | POA: Diagnosis not present

## 2019-11-15 DIAGNOSIS — N401 Enlarged prostate with lower urinary tract symptoms: Secondary | ICD-10-CM | POA: Diagnosis not present

## 2019-11-23 DIAGNOSIS — R8271 Bacteriuria: Secondary | ICD-10-CM | POA: Diagnosis not present

## 2019-11-23 DIAGNOSIS — R350 Frequency of micturition: Secondary | ICD-10-CM | POA: Diagnosis not present

## 2019-11-23 DIAGNOSIS — R3912 Poor urinary stream: Secondary | ICD-10-CM | POA: Diagnosis not present

## 2019-11-26 DIAGNOSIS — Z125 Encounter for screening for malignant neoplasm of prostate: Secondary | ICD-10-CM | POA: Diagnosis not present

## 2019-11-26 DIAGNOSIS — Z0000 Encounter for general adult medical examination without abnormal findings: Secondary | ICD-10-CM | POA: Diagnosis not present

## 2019-11-26 DIAGNOSIS — E7800 Pure hypercholesterolemia, unspecified: Secondary | ICD-10-CM | POA: Diagnosis not present

## 2019-12-07 DIAGNOSIS — R82998 Other abnormal findings in urine: Secondary | ICD-10-CM | POA: Diagnosis not present

## 2019-12-07 DIAGNOSIS — E7800 Pure hypercholesterolemia, unspecified: Secondary | ICD-10-CM | POA: Diagnosis not present

## 2019-12-07 DIAGNOSIS — Z0000 Encounter for general adult medical examination without abnormal findings: Secondary | ICD-10-CM | POA: Diagnosis not present

## 2019-12-07 DIAGNOSIS — I2510 Atherosclerotic heart disease of native coronary artery without angina pectoris: Secondary | ICD-10-CM | POA: Diagnosis not present

## 2019-12-14 DIAGNOSIS — L814 Other melanin hyperpigmentation: Secondary | ICD-10-CM | POA: Diagnosis not present

## 2019-12-14 DIAGNOSIS — T63424A Toxic effect of venom of ants, undetermined, initial encounter: Secondary | ICD-10-CM | POA: Diagnosis not present

## 2019-12-14 DIAGNOSIS — L578 Other skin changes due to chronic exposure to nonionizing radiation: Secondary | ICD-10-CM | POA: Diagnosis not present

## 2019-12-14 DIAGNOSIS — D225 Melanocytic nevi of trunk: Secondary | ICD-10-CM | POA: Diagnosis not present

## 2020-01-07 DIAGNOSIS — R350 Frequency of micturition: Secondary | ICD-10-CM | POA: Diagnosis not present

## 2020-01-07 DIAGNOSIS — R3912 Poor urinary stream: Secondary | ICD-10-CM | POA: Diagnosis not present

## 2020-03-01 ENCOUNTER — Ambulatory Visit: Payer: Self-pay | Admitting: Family Medicine

## 2020-03-30 ENCOUNTER — Other Ambulatory Visit: Payer: Self-pay | Admitting: *Deleted

## 2020-03-30 DIAGNOSIS — G40009 Localization-related (focal) (partial) idiopathic epilepsy and epileptic syndromes with seizures of localized onset, not intractable, without status epilepticus: Secondary | ICD-10-CM

## 2020-03-30 MED ORDER — CARBAMAZEPINE ER 200 MG PO TB12: ORAL_TABLET | ORAL | 11 refills | Status: DC

## 2020-04-25 DIAGNOSIS — H9313 Tinnitus, bilateral: Secondary | ICD-10-CM | POA: Diagnosis not present

## 2020-04-25 DIAGNOSIS — H903 Sensorineural hearing loss, bilateral: Secondary | ICD-10-CM | POA: Diagnosis not present

## 2020-05-02 ENCOUNTER — Ambulatory Visit: Payer: Self-pay | Admitting: Family Medicine

## 2020-05-18 DIAGNOSIS — Z4420 Encounter for fitting and adjustment of artificial eye, unspecified: Secondary | ICD-10-CM | POA: Diagnosis not present

## 2020-06-29 DIAGNOSIS — H903 Sensorineural hearing loss, bilateral: Secondary | ICD-10-CM | POA: Diagnosis not present

## 2020-08-29 ENCOUNTER — Emergency Department (HOSPITAL_COMMUNITY): Payer: BC Managed Care – PPO

## 2020-08-29 ENCOUNTER — Encounter (HOSPITAL_COMMUNITY): Payer: Self-pay

## 2020-08-29 ENCOUNTER — Other Ambulatory Visit: Payer: Self-pay

## 2020-08-29 ENCOUNTER — Emergency Department (HOSPITAL_COMMUNITY)
Admission: EM | Admit: 2020-08-29 | Discharge: 2020-08-29 | Disposition: A | Discharge status: Home / Self Care | Payer: BC Managed Care – PPO | Attending: Emergency Medicine | Admitting: Emergency Medicine

## 2020-08-29 DIAGNOSIS — R112 Nausea with vomiting, unspecified: Secondary | ICD-10-CM | POA: Diagnosis not present

## 2020-08-29 DIAGNOSIS — Z7982 Long term (current) use of aspirin: Secondary | ICD-10-CM | POA: Insufficient documentation

## 2020-08-29 DIAGNOSIS — I2510 Atherosclerotic heart disease of native coronary artery without angina pectoris: Secondary | ICD-10-CM | POA: Insufficient documentation

## 2020-08-29 DIAGNOSIS — E860 Dehydration: Secondary | ICD-10-CM | POA: Diagnosis not present

## 2020-08-29 DIAGNOSIS — Z20822 Contact with and (suspected) exposure to covid-19: Secondary | ICD-10-CM | POA: Insufficient documentation

## 2020-08-29 DIAGNOSIS — Z1152 Encounter for screening for COVID-19: Secondary | ICD-10-CM | POA: Diagnosis not present

## 2020-08-29 DIAGNOSIS — N390 Urinary tract infection, site not specified: Secondary | ICD-10-CM | POA: Diagnosis not present

## 2020-08-29 DIAGNOSIS — R5081 Fever presenting with conditions classified elsewhere: Secondary | ICD-10-CM | POA: Diagnosis not present

## 2020-08-29 DIAGNOSIS — R110 Nausea: Secondary | ICD-10-CM | POA: Diagnosis not present

## 2020-08-29 DIAGNOSIS — G40909 Epilepsy, unspecified, not intractable, without status epilepticus: Secondary | ICD-10-CM | POA: Diagnosis not present

## 2020-08-29 DIAGNOSIS — R509 Fever, unspecified: Secondary | ICD-10-CM

## 2020-08-29 DIAGNOSIS — J3489 Other specified disorders of nose and nasal sinuses: Secondary | ICD-10-CM | POA: Diagnosis not present

## 2020-08-29 DIAGNOSIS — M791 Myalgia, unspecified site: Secondary | ICD-10-CM | POA: Insufficient documentation

## 2020-08-29 DIAGNOSIS — E7800 Pure hypercholesterolemia, unspecified: Secondary | ICD-10-CM | POA: Diagnosis not present

## 2020-08-29 LAB — COMPREHENSIVE METABOLIC PANEL
ALT: 41 U/L (ref 0–44)
AST: 33 U/L (ref 15–41)
Albumin: 3.6 g/dL (ref 3.5–5.0)
Alkaline Phosphatase: 93 U/L (ref 38–126)
Anion gap: 11 (ref 5–15)
BUN: 13 mg/dL (ref 8–23)
CO2: 23 mmol/L (ref 22–32)
Calcium: 8.5 mg/dL — ABNORMAL LOW (ref 8.9–10.3)
Chloride: 95 mmol/L — ABNORMAL LOW (ref 98–111)
Creatinine, Ser: 1.32 mg/dL — ABNORMAL HIGH (ref 0.61–1.24)
GFR, Estimated: >60 mL/min (ref >60)
Glucose, Bld: 120 mg/dL — ABNORMAL HIGH (ref 70–99)
Potassium: 3.8 mmol/L (ref 3.5–5.1)
Sodium: 129 mmol/L — ABNORMAL LOW (ref 135–145)
Total Bilirubin: 0.7 mg/dL (ref 0.3–1.2)
Total Protein: 7.2 g/dL (ref 6.5–8.1)

## 2020-08-29 LAB — CBC
HCT: 43.2 % (ref 39.0–52.0)
Hemoglobin: 14.5 g/dL (ref 13.0–17.0)
MCH: 33.3 pg (ref 26.0–34.0)
MCHC: 33.6 g/dL (ref 30.0–36.0)
MCV: 99.3 fL (ref 80.0–100.0)
Platelets: 279 10*3/uL (ref 150–400)
RBC: 4.35 MIL/uL (ref 4.22–5.81)
RDW: 12.9 % (ref 11.5–15.5)
WBC: 13.1 10*3/uL — ABNORMAL HIGH (ref 4.0–10.5)
nRBC: 0 % (ref 0.0–0.2)

## 2020-08-29 LAB — URINALYSIS, ROUTINE W REFLEX MICROSCOPIC
Bilirubin Urine: NEGATIVE
Glucose, UA: NEGATIVE mg/dL
Ketones, ur: 80 mg/dL — AB
Leukocytes,Ua: NEGATIVE
Nitrite: NEGATIVE
Protein, ur: 100 mg/dL — AB
Specific Gravity, Urine: 1.031 — ABNORMAL HIGH (ref 1.005–1.030)
pH: 5 (ref 5.0–8.0)

## 2020-08-29 LAB — RESP PANEL BY RT-PCR (FLU A&B, COVID) ARPGX2
Influenza A by PCR: NEGATIVE
Influenza B by PCR: NEGATIVE
SARS Coronavirus 2 by RT PCR: NEGATIVE

## 2020-08-29 LAB — LACTIC ACID, PLASMA: Lactic Acid, Venous: 1.5 mmol/L (ref 0.5–1.9)

## 2020-08-29 MED ORDER — SODIUM CHLORIDE 0.9 % IV SOLN
1.0000 g | Freq: Once | INTRAVENOUS | Status: AC
  Administered 2020-08-29: 1 g via INTRAVENOUS
  Filled 2020-08-29: qty 10

## 2020-08-29 MED ORDER — AZITHROMYCIN 250 MG PO TABS
250.0000 mg | ORAL_TABLET | Freq: Every day | ORAL | 0 refills | Status: AC
Start: 2020-08-30 — End: 2020-09-03

## 2020-08-29 MED ORDER — LACTATED RINGERS IV BOLUS
1000.0000 mL | Freq: Once | INTRAVENOUS | Status: AC
  Administered 2020-08-29: 1000 mL via INTRAVENOUS

## 2020-08-29 MED ORDER — SODIUM CHLORIDE 0.9 % IV BOLUS
1000.0000 mL | Freq: Once | INTRAVENOUS | Status: AC
  Administered 2020-08-29: 1000 mL via INTRAVENOUS

## 2020-08-29 MED ORDER — SODIUM CHLORIDE 0.9 % IV SOLN
500.0000 mg | Freq: Once | INTRAVENOUS | Status: AC
  Administered 2020-08-29: 500 mg via INTRAVENOUS
  Filled 2020-08-29: qty 500

## 2020-08-29 MED ORDER — ACETAMINOPHEN 500 MG PO TABS
1000.0000 mg | ORAL_TABLET | Freq: Once | ORAL | Status: AC
  Administered 2020-08-29: 1000 mg via ORAL
  Filled 2020-08-29: qty 2

## 2020-08-29 MED ORDER — CEFDINIR 300 MG PO CAPS
300.0000 mg | ORAL_CAPSULE | Freq: Two times a day (BID) | ORAL | 0 refills | Status: DC
Start: 2020-08-29 — End: 2020-09-11

## 2020-08-29 NOTE — ED Triage Notes (Addendum)
C/o fever (102.5 at home) and fatigue since this  past Saturday ; taking tylenol at home with no relief; Sunday evening called pcp, who recommended taking tamiflu; has been taking that at home with no improvement; began having N/V after taking tamiflu; tested negative for flu and covid at MD office today, no known sick contacts; pt has had covid vaccines; denies cough, sob, urinary issues; denies pain, but still endorses constant nausea; last took tylenol this am

## 2020-08-29 NOTE — ED Provider Notes (Addendum)
[facility] EMERGENCY DEPARTMENT Provider Note   CSN: [id number] Arrival date & time: 08/29/20  1204     History Chief Complaint  Patient presents with  . Fever    Kenneth Olson is a 65 y.o. male.  Patient with fever to 102-103. Symptoms acute onset 3 days ago, moderate, recurrent, persistent. States unsure where fever coming from. Went to pcp, initially given rx tamiflu, which caused nausea, but indicates subsequent flu/covid test negative, and stopped tamiflu. Denies diarrhea. No abd pain. No chest pain or sob. No cough or uri symptoms. No sinus pain or headache. No neck pain or stiffness. No eye pain or change in vision. +body aches/generlized. No focal area of pain. No dysuria or urgency. No skin lesions/rash. No extremity pain or swelling. Went to pcp today and was sent to ED for further evaluation.   The history is provided by the patient.       Past Medical History:  Diagnosis Date  . Blindness of right eye    childhood incident  . CAD (coronary artery disease)   . Hyperlipidemia   . Seizures [facility])     Patient Active Problem List   Diagnosis Date Noted  . Partial idiopathic epilepsy with seizures of localized onset, not intractable, without status epilepticus (HCC) 01/21/2018  . Hyperlipidemia 01/13/2012  . CAD (coronary artery disease) of artery bypass graft 01/13/2012    Past Surgical History:  Procedure Laterality Date  . ANKLE SURGERY    . EYE SURGERY    . TONSILLECTOMY         Family History  Problem Relation Age of Onset  . Stroke Mother   . Heart attack Father        MI at age 52  . Heart disease Father   . Seizures Neg Hx     Social History   Tobacco Use  . Smoking status: Never Smoker  . Smokeless tobacco: Never Used  Vaping Use  . Vaping Use: Never used  Substance Use Topics  . Alcohol use: Yes    Comment: Occasional  . Drug use: No    Home Medications Prior to Admission medications   Medication Sig Start  Date End Date Taking? Authorizing Provider  aspirin 81 MG chewable tablet Chew 81 mg by mouth daily.    [provider]  atorvastatin (LIPITOR) 40 MG tablet TAKE 1 TABLET BY MOUTH EVERY DAY Patient taking differently: Take 40 mg by mouth daily.  05/18/13   Lewayne Bunting, MD  carbamazepine (TEGRETOL XR) 200 MG 12 hr tablet Take 2 tablets (400mg ) in the am and 3 tablets (600mg ) in the pm 03/30/20   Lomax, Amy, NP  dicyclomine (BENTYL) 20 MG tablet Take 1 tablet (20 mg total) by mouth 2 (two) times daily. 10/11/19   14/9/21, MD    Allergies    Pravastatin  Review of Systems   Review of Systems  Constitutional: Positive for fever.  HENT: Negative for congestion, sinus pain and sore throat.   Eyes: Negative for pain, redness and visual disturbance.  Respiratory: Negative for cough and shortness of breath.   Cardiovascular: Negative for chest pain.  Gastrointestinal: Negative for abdominal pain, diarrhea and vomiting.  Genitourinary: Negative for dysuria, flank pain, scrotal swelling and testicular pain.  Musculoskeletal: Positive for myalgias. Negative for back pain, neck pain and neck stiffness.  Skin: Negative for rash.  Neurological: Negative for weakness, numbness and headaches.  Hematological: Does not bruise/bleed easily.  Psychiatric/Behavioral: Negative  for confusion.    Physical Exam Updated Vital Signs BP (!) 146/82 (BP Location: Right Arm)   Pulse 96   Temp (!) 101.7 F (38.7 C)   Resp 19   Ht 1.702 m (5\' 7" )   Wt 68 kg   SpO2 98%   BMI 23.49 kg/m   Physical Exam Vitals and nursing note reviewed.  Constitutional:      Appearance: Normal appearance. He is well-developed.  HENT:     Head: Atraumatic.     Nose: Nose normal.     Mouth/Throat:     Mouth: Mucous membranes are moist.     Pharynx: Oropharynx is clear.  Eyes:     General: No scleral icterus.    Conjunctiva/sclera: Conjunctivae normal.     Pupils: Pupils are equal, round, and  reactive to light.  Neck:     Trachea: No tracheal deviation.     Comments: No stiffness or rigidity.  Cardiovascular:     Rate and Rhythm: Normal rate and regular rhythm.     Pulses: Normal pulses.     Heart sounds: Normal heart sounds. No murmur heard. No friction rub. No gallop.   Pulmonary:     Effort: Pulmonary effort is normal. No accessory muscle usage or respiratory distress.     Breath sounds: Rhonchi present.     Comments: ?mild rhonchi right base Abdominal:     General: Bowel sounds are normal. There is no distension.     Palpations: Abdomen is soft.     Tenderness: There is no abdominal tenderness. There is no guarding.  Genitourinary:    Comments: No cva tenderness. Musculoskeletal:        General: No swelling.     Cervical back: Normal range of motion and neck supple. No rigidity.     Comments: CTLS spine, non tender, aligned, no step off. Good rom bil extremities without pain or focal tenderness.   Skin:    General: Skin is warm and dry.     Findings: No rash.  Neurological:     Mental Status: He is alert.     Comments: Alert, speech clear. Motor/sens grossly intact bil.   Psychiatric:        Mood and Affect: Mood normal.     ED Results / Procedures / Treatments   Labs (all labs ordered are listed, but only abnormal results are displayed) Results for orders placed or performed during the hospital encounter of 08/29/20  Resp Panel by RT-PCR (Flu A&B, Covid) Nasopharyngeal Swab   Specimen: Nasopharyngeal Swab; Nasopharyngeal(NP) swabs in vial transport medium  Result Value Ref Range   SARS Coronavirus 2 by RT PCR NEGATIVE NEGATIVE   Influenza A by PCR NEGATIVE NEGATIVE   Influenza B by PCR NEGATIVE NEGATIVE  CBC  Result Value Ref Range   WBC 13.1 (H) 4.0 - 10.5 K/uL   RBC 4.35 4.22 - 5.81 MIL/uL   Hemoglobin 14.5 13.0 - 17.0 g/dL   HCT 10/29/20 67.6 - 19.5 %   MCV 99.3 80.0 - 100.0 fL   MCH 33.3 26.0 - 34.0 pg   MCHC 33.6 30.0 - 36.0 g/dL   RDW 09.3 26.7  - 12.4 %   Platelets 279 150 - 400 K/uL   nRBC 0.0 0.0 - 0.2 %  Comprehensive metabolic panel  Result Value Ref Range   Sodium 129 (L) 135 - 145 mmol/L   Potassium 3.8 3.5 - 5.1 mmol/L   Chloride 95 (L) 98 - 111 mmol/L  CO2 23 22 - 32 mmol/L   Glucose, Bld 120 (H) 70 - 99 mg/dL   BUN 13 8 - 23 mg/dL   Creatinine, Ser 0.27 (H) 0.61 - 1.24 mg/dL   Calcium 8.5 (L) 8.9 - 10.3 mg/dL   Total Protein 7.2 6.5 - 8.1 g/dL   Albumin 3.6 3.5 - 5.0 g/dL   AST 33 15 - 41 U/L   ALT 41 0 - 44 U/L   Alkaline Phosphatase 93 38 - 126 U/L   Total Bilirubin 0.7 0.3 - 1.2 mg/dL   GFR, Estimated >25 >36 mL/min   Anion gap 11 5 - 15  Urinalysis, Routine w reflex microscopic  Result Value Ref Range   Color, Urine AMBER (A) YELLOW   APPearance HAZY (A) CLEAR   Specific Gravity, Urine 1.031 (H) 1.005 - 1.030   pH 5.0 5.0 - 8.0   Glucose, UA NEGATIVE NEGATIVE mg/dL   Hgb urine dipstick MODERATE (A) NEGATIVE   Bilirubin Urine NEGATIVE NEGATIVE   Ketones, ur 80 (A) NEGATIVE mg/dL   Protein, ur 644 (A) NEGATIVE mg/dL   Nitrite NEGATIVE NEGATIVE   Leukocytes,Ua NEGATIVE NEGATIVE   RBC / HPF 6-10 0 - 5 RBC/hpf   WBC, UA 0-5 0 - 5 WBC/hpf   Bacteria, UA FEW (A) NONE SEEN   Mucus PRESENT   Lactic acid, plasma  Result Value Ref Range   Lactic Acid, Venous 1.5 0.5 - 1.9 mmol/L   DG Chest Port 1 View  Result Date: 08/29/2020 CLINICAL DATA:  Fever EXAM: PORTABLE CHEST 1 VIEW COMPARISON:  December 14, 2011 FINDINGS: There is ill-defined opacity in the inferior right base region. Lungs elsewhere clear. Heart size and pulmonary vascularity are normal. No adenopathy. No bone lesions. IMPRESSION: Ill-defined opacity concerning for developing pneumonia right base. Lungs elsewhere clear. Heart size normal. Electronically Signed   By: Bretta Bang III M.D.   On: 08/29/2020 13:14    EKG None  Radiology DG Chest Port 1 View  Result Date: 08/29/2020 CLINICAL DATA:  Fever EXAM: PORTABLE CHEST 1 VIEW  COMPARISON:  December 14, 2011 FINDINGS: There is ill-defined opacity in the inferior right base region. Lungs elsewhere clear. Heart size and pulmonary vascularity are normal. No adenopathy. No bone lesions. IMPRESSION: Ill-defined opacity concerning for developing pneumonia right base. Lungs elsewhere clear. Heart size normal. Electronically Signed   By: Bretta Bang III M.D.   On: 08/29/2020 13:14    Procedures Procedures   Medications Ordered in ED Medications  azithromycin (ZITHROMAX) 500 mg in sodium chloride 0.9 % 250 mL IVPB (500 mg Intravenous New Bag/Given 08/29/20 1454)  lactated ringers bolus 1,000 mL (has no administration in time range)  sodium chloride 0.9 % bolus 1,000 mL (1,000 mLs Intravenous New Bag/Given 08/29/20 1257)  cefTRIAXone (ROCEPHIN) 1 g in sodium chloride 0.9 % 100 mL IVPB (1 g Intravenous New Bag/Given 08/29/20 1419)    ED Course  I have reviewed the triage vital signs and the nursing notes.  Pertinent labs & imaging results that were available during my care of the patient were reviewed by me and considered in my medical decision making (see chart for details).    MDM Rules/Calculators/A&P                         Iv ns bolus. Labs sent. Cultures sent. Imaging ordered.  Reviewed nursing notes and prior charts for additional history.   Xrays reviewed/interpreted by me - right base infiltrate. ?  Pna.   Iv antibiotics given. Additional ns bolus.   Labs reviewed/interpreted by me - ?mild aki. ua without obvious infection, +ketones. covid neg.   On recheck, discussed tx options, admission, etc. Patient indicates feels improved, and prefers d/c to home.    Pt receiving additional ivf in ED, iv abx, po fluids/food. If continues improved anticipate d/c to home on po abx and close pcp f/u.  Return precautions provided.         Final Clinical Impression(s) / ED Diagnoses Final diagnoses:  None    Rx / DC Orders ED Discharge Orders    None          Cathren LaineSteinl, Charon Smedberg, MD 08/29/20 1539

## 2020-08-29 NOTE — Discharge Instructions (Addendum)
It was our pleasure to provide your ER care today - we hope that you feel better.  Drink plenty of fluids/stay well hydrated.   Take antibiotics as prescribed.   Take acetaminophen as need for fever.   Follow up closely with your doctor in the next 1-2 days - call office today or in AM tomorrow to arrange close follow up with them.  Return to ER right away if worse, new symptoms, new/severe pain, chest pain, trouble breathing, severe abdominal pain, vomiting, weak/fainting, or other concern.

## 2020-08-29 NOTE — ED Notes (Signed)
Gave pt [place] sandwich, crackers, and water.

## 2020-08-30 LAB — URINE CULTURE: Culture: NO GROWTH

## 2020-09-06 ENCOUNTER — Telehealth: Payer: Self-pay | Admitting: Family Medicine

## 2020-09-06 NOTE — Telephone Encounter (Signed)
Please let him know that I received labs from recent ER eval. His sodium is a little low. I have 131 and I see reading in ER was 129. Normal is 135-145. He was dehydrated and I am hoping he is feeling better, now. We do need to follow up with him to recheck sodium. Tegretol level was 12.7. I am not overly worried about this if he is feeling ok but we need to monitor closely. Last seen 2020. Appt 1/22 cancelled. Can we get him back in with me or MD for follow up in next 2-3 weeks. He should have labs repeated with PCP if he can not get in to see [place].

## 2020-09-07 NOTE — Patient Instructions (Signed)
Below is our plan:  We will continue carbamazepine 400mg  in the morning and 600mg  in the evening. Please continue to monitor symptoms and follow up with PCP asap.   Please make sure you are staying well hydrated. I recommend 50-60 ounces daily. Well balanced diet and regular exercise encouraged. Consistent sleep schedule with 6-8 hours recommended.   Please continue follow up with care team as directed.   Follow up with me in 1 year   You may receive a survey regarding today's visit. I encourage you to leave honest feed back as I do use this information to improve patient care. Thank you for seeing me today!      Seizure, Adult A seizure is a sudden burst of abnormal electrical and chemical activity in the brain. Seizures usually last from 30 seconds to 2 minutes.  What are the causes? Common causes of this condition include:  Fever or infection.  Problems that affect the brain. These may include: ? A brain or head injury. ? Bleeding in the brain. ? A brain tumor.  Low levels of blood sugar or salt.  Kidney problems or liver problems.  Conditions that are passed from parent to child (are inherited).  Problems with a substance, such as: ? Having a reaction to a drug or a medicine. ? Stopping the use of a substance all of a sudden (withdrawal).  A stroke.  Disorders that affect how you develop. Sometimes, the cause may not be known.  What increases the risk?  Having someone in your family who has epilepsy. In this condition, seizures happen again and again over time. They have no clear cause.  Having had a tonic-clonic seizure before. This type of seizure causes you to: ? Tighten the muscles of the whole body. ? Lose consciousness.  Having had a head injury or strokes before.  Having had a lack of oxygen at birth. What are the signs or symptoms? There are many types of seizures. The symptoms vary depending on the type of seizure you have. Symptoms during a  seizure  Shaking that you cannot control (convulsions) with fast, jerky movements of muscles.  Stiffness of the body.  Breathing problems.  Feeling mixed up (confused).  Staring or not responding to sound or touch.  Head nodding.  Eyes that blink, flutter, or move fast.  Drooling, grunting, or making clicking sounds with your mouth  Losing control of when you pee or poop. Symptoms before a seizure  Feeling afraid, nervous, or worried.  Feeling like you may vomit.  Feeling like: ? You are moving when you are not. ? Things around you are moving when they are not.  Feeling like you saw or heard something before (dj vu).  Odd tastes or smells.  Changes in how you see. You may see flashing lights or spots. Symptoms after a seizure  Feeling confused.  Feeling sleepy.  Headache.  Sore muscles. How is this treated? If your seizure stops on its own, you will not need treatment. If your seizure lasts longer than 5 minutes, you will normally need treatment. Treatment may include:  Medicines given through an IV tube.  Avoiding things, such as medicines, that are known to cause your seizures.  Medicines to prevent seizures.  A device to prevent or control seizures.  Surgery.  A diet low in carbohydrates and high in fat (ketogenic diet). Follow these instructions at home: Medicines  Take over-the-counter and prescription medicines only as told by your doctor.  Avoid foods or  drinks that may keep your medicine from working, such as alcohol. Activity  Follow instructions about driving, swimming, or doing things that would be dangerous if you had another seizure. Wait until your doctor says it is safe for you to do these things.  If you live in the [place] ask your local department of motor vehicles when you can drive.  Get a lot of rest. Teaching others  Teach friends and family what to do when you have a seizure. They should: ? Help you get down to the  ground. ? Protect your head and body. ? Loosen any clothing around your neck. ? Turn you on your side. ? Know whether or not you need emergency care. ? Stay with you until you are better.  Also, tell them what not to do if you have a seizure. Tell them: ? They should not hold you down. ? They should not put anything in your mouth.   General instructions  Avoid anything that gives you seizures.  Keep a seizure diary. Write down: ? What you remember about each seizure. ? What you think caused each seizure.  Keep all follow-up visits. Contact a doctor if:  You have another seizure or seizures. Call the doctor each time you have a seizure.  The pattern of your seizures changes.  You keep having seizures with treatment.  You have symptoms of being sick or having an infection.  You are not able to take your medicine. Get help right away if:  You have any of these problems: ? A seizure that lasts longer than 5 minutes. ? Many seizures in a row and you do not feel better between seizures. ? A seizure that makes it harder to breathe. ? A seizure and you can no longer speak or use part of your body.  You do not wake up right after a seizure.  You get hurt during a seizure.  You feel confused or have pain right after a seizure. These symptoms may be an emergency. Get help right away. Call your local emergency services (911 in the [place]  Do not wait to see if the symptoms will go away.  Do not drive yourself to the hospital. Summary  A seizure is a sudden burst of abnormal electrical and chemical activity in the brain. Seizures normally last from 30 seconds to 2 minutes.  Causes of seizures include illness, injury to the head, low levels of blood sugar or salt, and certain conditions.  Most seizures will stop on their own in less than 5 minutes. Seizures that last longer than 5 minutes are a medical emergency and need treatment right away.  Many medicines are used to  treat seizures. Take over-the-counter and prescription medicines only as told by your doctor. This information is not intended to replace advice given to you by your health care provider. Make sure you discuss any questions you have with your health care provider. Document Revised: 10/15/2019 Document Reviewed: 10/15/2019 Elsevier Patient Education  [place].

## 2020-09-07 NOTE — Telephone Encounter (Signed)
Called patient.  Wanted to make sure he received my MyChart email message and he did this morning. The appointment with AmyNP on 08/2320 at 1030 is fine so he will arrive around 10 for check-in.  He has new insurance [employer] and [employer] and he tried to get that electronically not sure if that made it through there but will bring his information at his appointment as well.  Appreciated call back.

## 2020-09-07 NOTE — Progress Notes (Addendum)
PATIENT: Kenneth Olson DOB: 1956-01-06  REASON FOR VISIT: follow up HISTORY FROM: patient  Chief Complaint  Patient presents with  . Follow-up    RM 1, alone, F/U for recent ER visit. Reports no SX like activity. Pt states he feels dizzy every time he does anything.      HISTORY OF PRESENT ILLNESS: 09/11/20 ALL: Kenneth Olson returns for follow up for seizures. He continues carbamazepine 400/600mg . He is tolerating well. No recent seizures. He was seen by ER last week for dehydration due to acute febrile illness. Workup was unremarkable with exception of concerns for right base infiltrate. He was treated with IVF and IV abx. He was able to eat and discharged as he was feeling better. Na 129, creatinine 1.32. Since, he continues to feel tired and dizzy. Symptoms worsen the more active he is. He was able to trim shubs this weekend but has been wiped out since. He is planning f/u with PCP this week.     He denies seizure activity. He continues to have fatigue and feeling exhausted He has quit his job effective 09/19/20. He admits that anxiety continues to be a concern. He is hoping he will feel better after taking some time off.    03/01/2019 ALL:  [name] is a 65 y.o. male here today for follow up for seizure. He continues Tegretol 400mg  in am and 600mg  in pm. Last seizure over 10 years ago. He is feeling well and without complaints. He exercises regularly. He is followed by PCP for HLD on atorvastatin. He has labs last in 10/2018 reviewed in office today. Na WNL.    HISTORY: (copied from Dr note on 01/21/2018)  Interval History 10/22019: Extremely well controlled, compliant, no seizures, no side effects. Discussed epilepsy, he has been seizure free for over 10 years, takes medications never misses, no side effects. He sees his pcp regularly. Continue AEDs will release back to pcp and have them refill med every year.   HPI:  Kenneth Olson is a 65 y.o. male here as a referral  from Dr. Anne Olson for seizures. Past medical history of partial seizures, syncope. Partial seizures without and with generalization. Partial seizures started in his 74s, thought he was passing out, he had a complete workup and no etiology found. Once he was placed on Tegretol he had no more episodes. Last episode was 10 years ago. He knows when the seizures are coming, feels lightheaded like he is going to pass out, he has time to pull over in the car or sit down. He loses consciousness and then they generalize with bilateral shaking, last a few minutes, denies post-ictal confusion but always vomits, is tired afterwards. No tongue biting or urinating. No head trauma or inciting event. No Fhx of seizures.  Has had DEXA  And was fine. No other focal neurologic deficits, associated symptoms, inciting events or modifiable factors.    Reviewed notes, labs and imaging from outside physicians, which showed:  MRI 01/2008:  Findings: There is no evidence for acute infarction, intracranial hemorrhage, mass lesion, hydrocephalus, or extra-axial fluid. There may be mild atrophy. There does appear to be mild small vessel disease. 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 There is mild chronic sinus disease. The calvarium is intact. Pituitary and cerebellar tonsils are unremarkable.  Post infusion, there is no abnormal enhancement of the brain or meninges. The major intracranial vascular structures appear widely Patent.  EEG 2011  IMPRESSION: Prosthetic globe unremarkable, right  orbit.  No abnormal intracranial enhancement and no acute intracranial Findings.  [facility] [place] Dr.[name], patient was last seen in October 2016 diagnosed with partial epilepsy. Previous to that he was seen in 2012. He is on Tegretol 200 mg 2 pills in the morning and 3 pills at night. Had dizziness on higher doses. He reported no seizures or fainting. He has a past medical  history of dizziness. Higher doses of Carbatrol made him dizzy. No additional problems except fatiguing. He was started on cholesterol several years prior. Exercising 3 times per week and no medical changes. Neurologic exam was nonfocal. He was under control, carbamazepine level and vitamin D and CBC and metabolic panel were checked.  Tegretol level 01/2015 11.6  note on 01/21/2018)  REVIEW OF SYSTEMS: Out of a complete 14 system review of symptoms, the patient complains only of the following symptoms, weakness and dizziness when stressed and all other reviewed systems are negative.  ALLERGIES: Allergies  Allergen Reactions  . Pravastatin     Dizziness; acne    HOME MEDICATIONS: Outpatient Medications Prior to Visit  Medication Sig Dispense Refill  . aspirin 81 MG chewable tablet Chew 81 mg by mouth daily.    Marland Kitchen atorvastatin (LIPITOR) 40 MG tablet TAKE 1 TABLET BY MOUTH EVERY DAY (Patient taking differently: Take 40 mg by mouth daily.) 30 tablet 0  . carbamazepine (TEGRETOL XR) 200 MG 12 hr tablet Take 2 tablets (400mg ) in the am and 3 tablets (600mg ) in the pm 150 tablet 11  . cefdinir (OMNICEF) 300 MG capsule Take 1 capsule (300 mg total) by mouth 2 (two) times daily. 14 capsule 0  . dicyclomine (BENTYL) 20 MG tablet Take 1 tablet (20 mg total) by mouth 2 (two) times daily. 20 tablet 0   No facility-administered medications prior to visit.    PAST MEDICAL HISTORY: Past Medical History:  Diagnosis Date  . Blindness of right eye    childhood incident  . CAD (coronary artery disease)   . Hyperlipidemia   . Seizures (HCC)     PAST SURGICAL HISTORY: Past Surgical History:  Procedure Laterality Date  . ANKLE SURGERY    . EYE SURGERY    . TONSILLECTOMY      FAMILY HISTORY: Family History  Problem Relation Age of Onset  . Stroke Mother   . Heart attack Father        MI at age 41  . Heart disease Father   . Seizures Neg Hx     SOCIAL HISTORY: Social History    Socioeconomic History  . Marital status: Married    Spouse name: Not on file  . Number of children: 2  . Years of education: Not on file  . Highest education level: Bachelor's degree (e.g., BA, AB, BS)  Occupational History    Comment: IT consultant  Tobacco Use  . Smoking status: Never Smoker  . Smokeless tobacco: Never Used  Vaping Use  . Vaping Use: Never used  Substance and Sexual Activity  . Alcohol use: Yes    Comment: Occasional  . Drug use: No  . Sexual activity: Yes  Other Topics Concern  . Not on file  Social History Narrative   Lives at home with his wife   Right handed   Caffeine: 4 cups daily   Social Determinants of Health   Financial Resource Strain: Not on file  Food Insecurity: Not on file  Transportation Needs: Not on file  Physical Activity:  Not on file  Stress: Not on file  Social Connections: Not on file  Intimate Partner Violence: Not on file      PHYSICAL EXAM  Vitals:   09/11/20 1020  BP: 118/74  Pulse: 68  SpO2: 98%  Weight: 146 lb (66.2 kg)  Height: 5\' 7"  (1.702 m)   Body mass index is 22.87 kg/m.  Generalized: Well developed, in no acute distress  Cardiology: normal rate and rhythm, no murmur noted Neurological examination  Mentation: Alert oriented to time, place, history taking. Follows all commands speech and language fluent Cranial nerve II-XII: Pupils were equal round reactive to light. Extraocular movements were full, visual field were full on confrontational test. Facial sensation and strength were normal. Uvula tongue midline. Head turning and shoulder shrug  were normal and symmetric. Motor: The motor testing reveals 5 over 5 strength of all 4 extremities. Good symmetric motor tone is noted throughout.  Sensory: Sensory testing is intact to soft touch on all 4 extremities. No evidence of extinction is noted.  Coordination: Cerebellar testing reveals good finger-nose-finger and heel-to-shin bilaterally.  Gait and  station: Gait is normal.   DIAGNOSTIC DATA (LABS, IMAGING, TESTING) - I reviewed patient records, labs, notes, testing and imaging myself where available.  No flowsheet data found.   Lab Results  Component Value Date   WBC 13.1 (H) 08/29/2020   HGB 14.5 08/29/2020   HCT 43.2 08/29/2020   MCV 99.3 08/29/2020   PLT 279 08/29/2020      Component Value Date/Time   NA 129 (L) 08/29/2020 1231   K 3.8 08/29/2020 1231   CL 95 (L) 08/29/2020 1231   CO2 23 08/29/2020 1231   GLUCOSE 120 (H) 08/29/2020 1231   BUN 13 08/29/2020 1231   CREATININE 1.32 (H) 08/29/2020 1231   CALCIUM 8.5 (L) 08/29/2020 1231   PROT 7.2 08/29/2020 1231   ALBUMIN 3.6 08/29/2020 1231   AST 33 08/29/2020 1231   ALT 41 08/29/2020 1231   ALKPHOS 93 08/29/2020 1231   BILITOT 0.7 08/29/2020 1231   GFRNONAA >60 08/29/2020 1231   GFRAA >60 10/11/2019 0937   Lab Results  Component Value Date   CHOL 212 (H) 03/12/2012   HDL 71.70 03/12/2012   LDLDIRECT 121.2 03/12/2012   TRIG 107.0 03/12/2012   CHOLHDL 3 03/12/2012   No results found for: HGBA1C No results found for: VITAMINB12 No results found for: TSH     ASSESSMENT AND PLAN 65 y.o. year old male  has a past medical history of Blindness of right eye, CAD (coronary artery disease), Hyperlipidemia, and Seizures (HCC). here with     ICD-10-CM   1. Partial idiopathic epilepsy with seizures of localized onset, not intractable, without status epilepticus (HCC)  G40.009 CMP    Carbamazepine level, total    carbamazepine (TEGRETOL XR) 200 MG 12 hr tablet     Fayrene FearingJames is doing well from a seizure perspective.  He is tolerating carbamazepine with no obvious adverse effects. He will continue carbamazepine 400mg  in am and 600mg  in pm. He is experiencing fatigue and dizziness following diagnosis of pneumonia and UTI last week. He was encouraged to follow up with PCP if symptoms are not improving over the next few days or for any significant worsening. He will continue  close follow-up with PCP for annual wellness and management of cholesterol.  Regular exercise and well-balanced diet advised.  He will follow-up with me in 1 year, sooner if needed.  He verbalizes understanding and agreement with  this plan.    Orders Placed This Encounter  Procedures  . CMP  . Carbamazepine level, total     Meds ordered this encounter  Medications  . carbamazepine (TEGRETOL XR) 200 MG 12 hr tablet    Sig: Take 2 tablets (400mg ) in the am and 3 tablets (600mg ) in the pm    Dispense:  450 tablet    Refill:  3    Order Specific Question:   Supervising Provider    Answer:    , FNP-C 09/11/2020, 11:01 AM [facility] [address] [place], [address] [place] [phone]  agree with assessment and plan as stated.     [id number], MD [facility]

## 2020-09-11 ENCOUNTER — Other Ambulatory Visit: Payer: Self-pay

## 2020-09-11 ENCOUNTER — Encounter: Payer: Self-pay | Admitting: Family Medicine

## 2020-09-11 ENCOUNTER — Ambulatory Visit: Payer: BC Managed Care – PPO | Admitting: Family Medicine

## 2020-09-11 VITALS — BP 118/74 | HR 68 | Ht 67.0 in | Wt 146.0 lb

## 2020-09-11 DIAGNOSIS — G40009 Localization-related (focal) (partial) idiopathic epilepsy and epileptic syndromes with seizures of localized onset, not intractable, without status epilepticus: Secondary | ICD-10-CM

## 2020-09-11 MED ORDER — CARBAMAZEPINE ER 200 MG PO TB12: ORAL_TABLET | ORAL | 3 refills | Status: DC

## 2020-09-12 DIAGNOSIS — G40009 Localization-related (focal) (partial) idiopathic epilepsy and epileptic syndromes with seizures of localized onset, not intractable, without status epilepticus: Secondary | ICD-10-CM | POA: Diagnosis not present

## 2020-09-12 LAB — COMPREHENSIVE METABOLIC PANEL
ALT: 65 IU/L — ABNORMAL HIGH (ref 0–44)
AST: 29 IU/L (ref 0–40)
Albumin/Globulin Ratio: 2 (ref 1.2–2.2)
Albumin: 4.3 g/dL (ref 3.8–4.8)
Alkaline Phosphatase: 180 IU/L — ABNORMAL HIGH (ref 44–121)
BUN/Creatinine Ratio: 13 (ref 10–24)
BUN: 13 mg/dL (ref 8–27)
Bilirubin Total: 0.3 mg/dL (ref 0.0–1.2)
CO2: 22 mmol/L (ref 20–29)
Calcium: 9.1 mg/dL (ref 8.6–10.2)
Chloride: 103 mmol/L (ref 96–106)
Creatinine, Ser: 1.01 mg/dL (ref 0.76–1.27)
Globulin, Total: 2.2 g/dL (ref 1.5–4.5)
Glucose: 83 mg/dL (ref 65–99)
Potassium: 5.3 mmol/L — ABNORMAL HIGH (ref 3.5–5.2)
Sodium: 141 mmol/L (ref 134–144)
Total Protein: 6.5 g/dL (ref 6.0–8.5)
eGFR: 84 mL/min/{1.73_m2} (ref >59)

## 2020-09-12 LAB — CARBAMAZEPINE LEVEL, TOTAL: Carbamazepine (Tegretol), S: 11.9 ug/mL (ref 4.0–12.0)

## 2020-12-21 DIAGNOSIS — D225 Melanocytic nevi of trunk: Secondary | ICD-10-CM | POA: Diagnosis not present

## 2020-12-21 DIAGNOSIS — D2272 Melanocytic nevi of left lower limb, including hip: Secondary | ICD-10-CM | POA: Diagnosis not present

## 2020-12-21 DIAGNOSIS — L578 Other skin changes due to chronic exposure to nonionizing radiation: Secondary | ICD-10-CM | POA: Diagnosis not present

## 2020-12-21 DIAGNOSIS — L814 Other melanin hyperpigmentation: Secondary | ICD-10-CM | POA: Diagnosis not present

## 2020-12-22 DIAGNOSIS — E7800 Pure hypercholesterolemia, unspecified: Secondary | ICD-10-CM | POA: Diagnosis not present

## 2020-12-22 DIAGNOSIS — Z125 Encounter for screening for malignant neoplasm of prostate: Secondary | ICD-10-CM | POA: Diagnosis not present

## 2020-12-29 DIAGNOSIS — Z0000 Encounter for general adult medical examination without abnormal findings: Secondary | ICD-10-CM | POA: Diagnosis not present

## 2020-12-29 DIAGNOSIS — R82998 Other abnormal findings in urine: Secondary | ICD-10-CM | POA: Diagnosis not present

## 2020-12-29 DIAGNOSIS — G40009 Localization-related (focal) (partial) idiopathic epilepsy and epileptic syndromes with seizures of localized onset, not intractable, without status epilepticus: Secondary | ICD-10-CM | POA: Diagnosis not present

## 2020-12-29 DIAGNOSIS — Z1339 Encounter for screening examination for other mental health and behavioral disorders: Secondary | ICD-10-CM | POA: Diagnosis not present

## 2020-12-29 DIAGNOSIS — E7800 Pure hypercholesterolemia, unspecified: Secondary | ICD-10-CM | POA: Diagnosis not present

## 2020-12-29 DIAGNOSIS — Z1331 Encounter for screening for depression: Secondary | ICD-10-CM | POA: Diagnosis not present

## 2020-12-29 DIAGNOSIS — Z23 Encounter for immunization: Secondary | ICD-10-CM | POA: Diagnosis not present

## 2021-01-03 DIAGNOSIS — Z1212 Encounter for screening for malignant neoplasm of rectum: Secondary | ICD-10-CM | POA: Diagnosis not present

## 2021-01-18 DIAGNOSIS — M1812 Unilateral primary osteoarthritis of first carpometacarpal joint, left hand: Secondary | ICD-10-CM | POA: Diagnosis not present

## 2021-01-18 DIAGNOSIS — M79641 Pain in right hand: Secondary | ICD-10-CM | POA: Diagnosis not present

## 2021-02-07 DIAGNOSIS — E7800 Pure hypercholesterolemia, unspecified: Secondary | ICD-10-CM | POA: Diagnosis not present

## 2021-02-15 DIAGNOSIS — M1812 Unilateral primary osteoarthritis of first carpometacarpal joint, left hand: Secondary | ICD-10-CM | POA: Diagnosis not present

## 2021-02-15 DIAGNOSIS — M19041 Primary osteoarthritis, right hand: Secondary | ICD-10-CM | POA: Diagnosis not present

## 2021-03-20 DIAGNOSIS — M1812 Unilateral primary osteoarthritis of first carpometacarpal joint, left hand: Secondary | ICD-10-CM | POA: Diagnosis not present

## 2021-03-20 DIAGNOSIS — M19041 Primary osteoarthritis, right hand: Secondary | ICD-10-CM | POA: Diagnosis not present

## 2021-03-23 DIAGNOSIS — R350 Frequency of micturition: Secondary | ICD-10-CM | POA: Diagnosis not present

## 2021-03-23 DIAGNOSIS — R3912 Poor urinary stream: Secondary | ICD-10-CM | POA: Diagnosis not present

## 2021-04-30 DIAGNOSIS — H903 Sensorineural hearing loss, bilateral: Secondary | ICD-10-CM | POA: Diagnosis not present

## 2021-09-11 NOTE — Patient Instructions (Signed)
Below is our plan:  We will continue carbamazepine 400mg  in the morning and 600mg  at bedtime. We will update labs, today.   Please make sure you are consistent with timing of seizure medication. I recommend annual visit with primary care provider (PCP) for complete physical and routine blood work. We will monitor vitamin D level. I recommend daily intake of vitamin D (400-800iu) and calcium (800-1000mg ) for bone health. Discuss Dexa screening with PCP.   According to [place] law, you can not drive unless you are seizure / syncope free for at least 6 months and under physician's care.  Please maintain precautions. Do not participate in activities where a loss of awareness could harm you or someone else. No swimming alone, no tub bathing, no hot tubs, no driving, no operating motorized vehicles (cars, ATVs, motocycles, etc), lawnmowers, power tools or firearms. No standing at heights, such as rooftops, ladders or stairs. Avoid hot objects such as stoves, heaters, open fires. Wear a helmet when riding a bicycle, scooter, skateboard, etc. and avoid areas of traffic. Set your water heater to 120 degrees or less.    Please make sure you are staying well hydrated. I recommend 50-60 ounces daily. Well balanced diet and regular exercise encouraged. Consistent sleep schedule with 6-8 hours recommended.   Please continue follow up with care team as directed.   Follow up with me in 1 year   You may receive a survey regarding today's visit. I encourage you to leave honest feed back as I do use this information to improve patient care. Thank you for seeing me today!

## 2021-09-11 NOTE — Progress Notes (Unsigned)
PATIENT: Kenneth Olson DOB: May 09, 1955  REASON FOR VISIT: follow up HISTORY FROM: patient  No chief complaint on file.    HISTORY OF PRESENT ILLNESS:  09/11/21 ALL: Kenneth Olson returns for follow up for seizures. He continues carbamazepine 400/600.   09/11/2020 ALL:  Kenneth Olson returns for follow up for seizures. He continues carbamazepine 400/600mg . He is tolerating well. No recent seizures. He was seen by ER last week for dehydration due to acute febrile illness. Workup was unremarkable with exception of concerns for right base infiltrate. He was treated with IVF and IV abx. He was able to eat and discharged as he was feeling better. Na 129, creatinine 1.32. Since, he continues to feel tired and dizzy. Symptoms worsen the more active he is. He was able to trim shubs this weekend but has been wiped out since. He is planning f/u with PCP this week.     He denies seizure activity. He continues to have fatigue and feeling exhausted He has quit his job effective 09/19/20. He admits that anxiety continues to be a concern. He is hoping he will feel better after taking some time off.    03/01/2019 ALL:  Kenneth Olson is a 66 y.o. male here today for follow up for seizure. He continues Tegretol 400mg  in am and 600mg  in pm. Last seizure over 10 years ago. He is feeling well and without complaints. He exercises regularly. He is followed by PCP for HLD on atorvastatin. He has labs last in 10/2018 reviewed in office today. Na WNL.    HISTORY: (copied from Dr note on 01/21/2018)  Interval History 10/22019: Extremely well controlled, compliant, no seizures, no side effects. Discussed epilepsy, he has been seizure free for over 10 years, takes medications never misses, no side effects. He sees his pcp regularly. Continue AEDs will release back to pcp and have them refill med every year.    HPI:  Kenneth Olson is a 66 y.o. male here as a referral from Dr. Anne Olson for seizures. Past medical history of  partial seizures, syncope. Partial seizures without and with generalization. Partial seizures started in his 71s, thought he was passing out, he had a complete workup and no etiology found. Once he was placed on Tegretol he had no more episodes. Last episode was 10 years ago. He knows when the seizures are coming, feels lightheaded like he is going to pass out, he has time to pull over in the car or sit down. He loses consciousness and then they generalize with bilateral shaking, last a few minutes, denies post-ictal confusion but always vomits, is tired afterwards. No tongue biting or urinating. No head trauma or inciting event. No Fhx of seizures.  Has had DEXA  And was fine. No other focal neurologic deficits, associated symptoms, inciting events or modifiable factors.      Reviewed notes, labs and imaging from outside physicians, which showed:   MRI 01/2008:   Findings: There is no evidence for acute infarction, intracranial hemorrhage, mass lesion, hydrocephalus, or extra-axial fluid. There may be mild atrophy.  There does appear to be mild small vessel disease.  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 There is mild chronic sinus disease.  The  calvarium is intact. Pituitary and cerebellar tonsils are unremarkable.   Post infusion, there is no abnormal enhancement of the brain or meninges.  The major intracranial vascular structures appear widely Patent.   EEG 2011   IMPRESSION: Prosthetic globe unremarkable, right orbit.  No abnormal intracranial enhancement and no acute intracranial Findings.   [facility] [place] Dr.[name], patient was last seen in October 2016 diagnosed with partial epilepsy. Previous to that he was seen in 2012. He is on Tegretol 200 mg 2 pills in the morning and 3 pills at night. Had dizziness on higher doses. He reported no seizures or fainting. He has a past medical history of dizziness. Higher doses of Carbatrol made him  dizzy. No additional problems except fatiguing. He was started on cholesterol several years prior. Exercising 3 times per week and no medical changes. Neurologic exam was nonfocal. He was under control, carbamazepine level and vitamin D and CBC and metabolic panel were checked.   Tegretol level 01/2015 11.6  note on 01/21/2018)  REVIEW OF SYSTEMS: Out of a complete 14 system review of symptoms, the patient complains only of the following symptoms, weakness and dizziness when stressed and all other reviewed systems are negative.  ALLERGIES: Allergies  Allergen Reactions   Pravastatin     Dizziness; acne    HOME MEDICATIONS: Outpatient Medications Prior to Visit  Medication Sig Dispense Refill   aspirin 81 MG chewable tablet Chew 81 mg by mouth daily.     atorvastatin (LIPITOR) 40 MG tablet TAKE 1 TABLET BY MOUTH EVERY DAY (Patient taking differently: Take 40 mg by mouth daily.) 30 tablet 0   carbamazepine (TEGRETOL XR) 200 MG 12 hr tablet Take 2 tablets (400mg ) in the am and 3 tablets (600mg ) in the pm 450 tablet 3   No facility-administered medications prior to visit.    PAST MEDICAL HISTORY: Past Medical History:  Diagnosis Date   Blindness of right eye    childhood incident   CAD (coronary artery disease)    Hyperlipidemia    Seizures (HCC)     PAST SURGICAL HISTORY: Past Surgical History:  Procedure Laterality Date   ANKLE SURGERY     EYE SURGERY     TONSILLECTOMY      FAMILY HISTORY: Family History  Problem Relation Age of Onset   Stroke Mother    Heart attack Father        MI at age 64   Heart disease Father    Seizures Neg Hx     SOCIAL HISTORY: Social History   Socioeconomic History   Marital status: Married    Spouse name: Not on file   Number of children: 2   Years of education: Not on file   Highest education level: Bachelor's degree (e.g., BA, AB, BS)  Occupational History    Comment: IT consultant  Tobacco Use   Smoking status: Never    Smokeless tobacco: Never  Vaping Use   Vaping Use: Never used  Substance and Sexual Activity   Alcohol use: Yes    Comment: Occasional   Drug use: No   Sexual activity: Yes  Other Topics Concern   Not on file  Social History Narrative   Lives at home with his wife   Right handed   Caffeine: 4 cups daily   Social Determinants of Health   Financial Resource Strain: Not on file  Food Insecurity: Not on file  Transportation Needs: Not on file  Physical Activity: Not on file  Stress: Not on file  Social Connections: Not on file  Intimate Partner Violence: Not on file      PHYSICAL EXAM  There were no vitals filed for this visit.  There is no height or weight on file  to calculate BMI.  Generalized: Well developed, in no acute distress  Cardiology: normal rate and rhythm, no murmur noted Neurological examination  Mentation: Alert oriented to time, place, history taking. Follows all commands speech and language fluent Cranial nerve II-XII: Pupils were equal round reactive to light. Extraocular movements were full, visual field were full on confrontational test. Facial sensation and strength were normal. Uvula tongue midline. Head turning and shoulder shrug  were normal and symmetric. Motor: The motor testing reveals 5 over 5 strength of all 4 extremities. Good symmetric motor tone is noted throughout.  Sensory: Sensory testing is intact to soft touch on all 4 extremities. No evidence of extinction is noted.  Coordination: Cerebellar testing reveals good finger-nose-finger and heel-to-shin bilaterally.  Gait and station: Gait is normal.   DIAGNOSTIC DATA (LABS, IMAGING, TESTING) - I reviewed patient records, labs, notes, testing and imaging myself where available.      View : No data to display.           Lab Results  Component Value Date   WBC 13.1 (H) 08/29/2020   HGB 14.5 08/29/2020   HCT 43.2 08/29/2020   MCV 99.3 08/29/2020   PLT 279 08/29/2020       Component Value Date/Time   NA 141 09/11/2020 1101   K 5.3 (H) 09/11/2020 1101   CL 103 09/11/2020 1101   CO2 22 09/11/2020 1101   GLUCOSE 83 09/11/2020 1101   GLUCOSE 120 (H) 08/29/2020 1231   BUN 13 09/11/2020 1101   CREATININE 1.01 09/11/2020 1101   CALCIUM 9.1 09/11/2020 1101   PROT 6.5 09/11/2020 1101   ALBUMIN 4.3 09/11/2020 1101   AST 29 09/11/2020 1101   ALT 65 (H) 09/11/2020 1101   ALKPHOS 180 (H) 09/11/2020 1101   BILITOT 0.3 09/11/2020 1101   GFRNONAA >60 08/29/2020 1231   GFRAA >60 10/11/2019 0937   Lab Results  Component Value Date   CHOL 212 (H) 03/12/2012   HDL 71.70 03/12/2012   LDLDIRECT 121.2 03/12/2012   TRIG 107.0 03/12/2012   CHOLHDL 3 03/12/2012   No results found for: HGBA1C No results found for: VITAMINB12 No results found for: TSH     ASSESSMENT AND PLAN 66 y.o. year old male  has a past medical history of Blindness of right eye, CAD (coronary artery disease), Hyperlipidemia, and Seizures (HCC). here with   No diagnosis found.    Jamon is doing well from a seizure perspective.  He is tolerating carbamazepine with no obvious adverse effects. He will continue carbamazepine 400mg  in am and 600mg  in pm. He is experiencing fatigue and dizziness following diagnosis of pneumonia and UTI last week. He was encouraged to follow up with PCP if symptoms are not improving over the next few days or for any significant worsening. He will continue close follow-up with PCP for annual wellness and management of cholesterol.  Regular exercise and well-balanced diet advised.  He will follow-up with me in 1 year, sooner if needed.  He verbalizes understanding and agreement with this plan.    No orders of the defined types were placed in this encounter.    No orders of the defined types were placed in this encounter.     , FNP-C 09/11/2021, 5:10 PM [facility] [address], [address] [place], [address] [place] [phone]   [facility]

## 2021-09-12 ENCOUNTER — Encounter: Payer: Self-pay | Admitting: Family Medicine

## 2021-09-12 ENCOUNTER — Ambulatory Visit: Payer: BC Managed Care – PPO | Admitting: Family Medicine

## 2021-09-12 VITALS — BP 118/73 | HR 64 | Ht 67.0 in | Wt 154.0 lb

## 2021-09-12 DIAGNOSIS — G40009 Localization-related (focal) (partial) idiopathic epilepsy and epileptic syndromes with seizures of localized onset, not intractable, without status epilepticus: Secondary | ICD-10-CM | POA: Diagnosis not present

## 2021-09-12 MED ORDER — CARBAMAZEPINE ER 200 MG PO TB12: ORAL_TABLET | ORAL | 3 refills | Status: DC

## 2021-09-13 LAB — COMPREHENSIVE METABOLIC PANEL
ALT: 41 IU/L (ref 0–44)
AST: 29 IU/L (ref 0–40)
Albumin/Globulin Ratio: 2.5 — ABNORMAL HIGH (ref 1.2–2.2)
Albumin: 4.5 g/dL (ref 3.8–4.8)
Alkaline Phosphatase: 92 IU/L (ref 44–121)
BUN/Creatinine Ratio: 13 (ref 10–24)
BUN: 15 mg/dL (ref 8–27)
Bilirubin Total: 0.3 mg/dL (ref 0.0–1.2)
CO2: 24 mmol/L (ref 20–29)
Calcium: 9.3 mg/dL (ref 8.6–10.2)
Chloride: 103 mmol/L (ref 96–106)
Creatinine, Ser: 1.13 mg/dL (ref 0.76–1.27)
Globulin, Total: 1.8 g/dL (ref 1.5–4.5)
Glucose: 90 mg/dL (ref 70–99)
Potassium: 4.2 mmol/L (ref 3.5–5.2)
Sodium: 138 mmol/L (ref 134–144)
Total Protein: 6.3 g/dL (ref 6.0–8.5)
eGFR: 73 mL/min/{1.73_m2} (ref >59)

## 2021-09-13 LAB — CARBAMAZEPINE LEVEL, TOTAL: Carbamazepine (Tegretol), S: 8.5 ug/mL (ref 4.0–12.0)

## 2021-12-27 DIAGNOSIS — E7800 Pure hypercholesterolemia, unspecified: Secondary | ICD-10-CM | POA: Diagnosis not present

## 2021-12-27 DIAGNOSIS — Z125 Encounter for screening for malignant neoplasm of prostate: Secondary | ICD-10-CM | POA: Diagnosis not present

## 2022-01-02 DIAGNOSIS — D2272 Melanocytic nevi of left lower limb, including hip: Secondary | ICD-10-CM | POA: Diagnosis not present

## 2022-01-02 DIAGNOSIS — L570 Actinic keratosis: Secondary | ICD-10-CM | POA: Diagnosis not present

## 2022-01-02 DIAGNOSIS — L814 Other melanin hyperpigmentation: Secondary | ICD-10-CM | POA: Diagnosis not present

## 2022-01-02 DIAGNOSIS — D225 Melanocytic nevi of trunk: Secondary | ICD-10-CM | POA: Diagnosis not present

## 2022-01-02 DIAGNOSIS — L578 Other skin changes due to chronic exposure to nonionizing radiation: Secondary | ICD-10-CM | POA: Diagnosis not present

## 2022-01-04 DIAGNOSIS — Z1339 Encounter for screening examination for other mental health and behavioral disorders: Secondary | ICD-10-CM | POA: Diagnosis not present

## 2022-01-04 DIAGNOSIS — Z0000 Encounter for general adult medical examination without abnormal findings: Secondary | ICD-10-CM | POA: Diagnosis not present

## 2022-01-04 DIAGNOSIS — R82998 Other abnormal findings in urine: Secondary | ICD-10-CM | POA: Diagnosis not present

## 2022-01-04 DIAGNOSIS — G40009 Localization-related (focal) (partial) idiopathic epilepsy and epileptic syndromes with seizures of localized onset, not intractable, without status epilepticus: Secondary | ICD-10-CM | POA: Diagnosis not present

## 2022-01-04 DIAGNOSIS — Z1331 Encounter for screening for depression: Secondary | ICD-10-CM | POA: Diagnosis not present

## 2022-01-31 DIAGNOSIS — M25532 Pain in left wrist: Secondary | ICD-10-CM | POA: Diagnosis not present

## 2022-01-31 DIAGNOSIS — S63502A Unspecified sprain of left wrist, initial encounter: Secondary | ICD-10-CM | POA: Diagnosis not present

## 2022-02-18 NOTE — Progress Notes (Addendum)
Referring-Richard Tisovec MD Reason for referral-coronary artery disease  HPI: 66 year old male for evaluation of coronary artery disease at request of Domenick Gong MD.  Patient seen previously but not since September 2013.  Cardiac CTA August 2013 showed calcium score 86.4 which was 82nd percentile; mild disease in the LAD and right coronary artery; 2 small left pulmonary nodules.  Laboratory September 2023 showed creatinine 1.0, sodium 138, potassium 4.1, normal liver functions, hemoglobin 15.4, total cholesterol 168 with LDL 80.  Patient denies dyspnea on exertion, orthopnea, PND, pedal edema, exertional chest pain or syncope.  Cardiology now asked to evaluate.  Current Outpatient Medications  Medication Sig Dispense Refill   aspirin 81 MG chewable tablet Chew 81 mg by mouth daily.     carbamazepine (TEGRETOL XR) 200 MG 12 hr tablet Take 2 tablets (400mg ) in the am and 3 tablets (600mg ) in the pm 450 tablet 3   ezetimibe (ZETIA) 10 MG tablet Take 10 mg by mouth daily.     Rosuvastatin Calcium 40 MG CPSP Take 1 tablet by mouth daily.     tamsulosin (FLOMAX) 0.4 MG CAPS capsule Take 0.8 mg by mouth daily.     No current facility-administered medications for this visit.    Allergies  Allergen Reactions   Pravastatin     Dizziness; acne     Past Medical History:  Diagnosis Date   Blindness of right eye    childhood incident   CAD (coronary artery disease)    Hyperlipidemia    Seizures (HCC)     Past Surgical History:  Procedure Laterality Date   ANKLE SURGERY     EYE SURGERY     TONSILLECTOMY      Social History   Socioeconomic History   Marital status: Married    Spouse name: Not on file   Number of children: 2   Years of education: Not on file   Highest education level: Bachelor's degree (e.g., BA, AB, BS)  Occupational History    Comment: IT consultant  Tobacco Use   Smoking status: Never   Smokeless tobacco: Never  Vaping Use   Vaping Use: Never used   Substance and Sexual Activity   Alcohol use: Yes    Comment: Occasional   Drug use: No   Sexual activity: Yes  Other Topics Concern   Not on file  Social History Narrative   Lives at home with his wife   Right handed   Caffeine: 4 cups daily   Social Determinants of Health   Financial Resource Strain: Not on file  Food Insecurity: Not on file  Transportation Needs: Not on file  Physical Activity: Not on file  Stress: Not on file  Social Connections: Not on file  Intimate Partner Violence: Not on file    Family History  Problem Relation Age of Onset   Stroke Mother    Heart attack Father        MI at age 32   Heart disease Father    Seizures Neg Hx     ROS: Pain in wrist from recent golf injury but no fevers or chills, productive cough, hemoptysis, dysphasia, odynophagia, melena, hematochezia, dysuria, hematuria, rash, seizure activity, orthopnea, PND, pedal edema, claudication. Remaining systems are negative.  Physical Exam:   Blood pressure 138/74, pulse 66, height 5\' 7"  (1.702 m), weight 155 lb 9.6 oz (70.6 kg), SpO2 98 %.  General:  Well developed/well nourished in NAD Skin warm/dry Patient not depressed No peripheral clubbing Back-normal  HEENT-artificial right eye. Neck supple/normal carotid upstroke bilaterally; no bruits; no JVD; no thyromegaly chest - CTA/ normal expansion CV - RRR/normal S1 and S2; no murmurs, rubs or gallops;  PMI nondisplaced Abdomen -NT/ND, no HSM, no mass, + bowel sounds, positive bruit 2+ femoral pulses, no bruits Ext-no edema, chords, 2+ DP Neuro-grossly nonfocal  ECG -normal sinus rhythm at a rate of 66, no ST changes.  Personally reviewed  A/P  1 coronary artery disease-patient has a history of mildly elevated calcium score and minimal disease in the distal LAD.  However this was 10 years ago.  We will repeat study.  Continue aspirin and statin.  He denies chest pain.  Strong family history of coronary artery disease.  2  hyperlipidemia-continue Crestor and Zetia.  His most recent LDL was 80.  I will refer to lipid clinic for consideration of PCSK9.  Goal LDL 55.  We will also check lipoprotein A.  3 elevated blood pressure reading-patient's blood pressure is borderline.  I have asked him to follow this at home and we will add medications if necessary.  Goal systolic blood pressure 130 or less and diastolic blood pressure 85 or less.  4 abdominal bruit-schedule ultrasound to rule out aneurysm as patient has a family history of abdominal aortic aneurysm in his father.  Olga Millers, MD

## 2022-02-21 DIAGNOSIS — M25532 Pain in left wrist: Secondary | ICD-10-CM | POA: Diagnosis not present

## 2022-02-25 ENCOUNTER — Encounter: Payer: Self-pay | Admitting: Cardiology

## 2022-02-25 ENCOUNTER — Ambulatory Visit: Payer: BC Managed Care – PPO | Attending: Cardiology | Admitting: Cardiology

## 2022-02-25 VITALS — BP 138/74 | HR 66 | Ht 67.0 in | Wt 155.6 lb

## 2022-02-25 DIAGNOSIS — I25700 Atherosclerosis of coronary artery bypass graft(s), unspecified, with unstable angina pectoris: Secondary | ICD-10-CM | POA: Diagnosis not present

## 2022-02-25 DIAGNOSIS — E7800 Pure hypercholesterolemia, unspecified: Secondary | ICD-10-CM | POA: Diagnosis not present

## 2022-02-25 DIAGNOSIS — R0989 Other specified symptoms and signs involving the circulatory and respiratory systems: Secondary | ICD-10-CM

## 2022-02-25 NOTE — Patient Instructions (Signed)
  Lab Work:  Your physician recommends that you return for lab work FASTING  If you have labs (blood work) drawn today and your tests are completely normal, you will receive your results only by: [place] (if you have [place]) OR A paper copy in the mail If you have any lab test that is abnormal or we need to change your treatment, we will call you to review the results.   Testing/Procedures:  CORONARY CALCIUM SCORING CT SCAN AT THE [facility]   Follow-Up: At [facility], you and your health needs are our priority.  As part of our continuing mission to provide you with exceptional heart care, we have created designated Provider Care Teams.  These Care Teams include your primary Cardiologist (physician) and Advanced Practice Providers (APPs -  Physician Assistants and Nurse Practitioners) who all work together to provide you with the care you need, when you need it.  We recommend signing up for the patient portal called "MyChart".  Sign up information is provided on this After Visit Summary.  MyChart is used to connect with patients for Virtual Visits (Telemedicine).  Patients are able to view lab/test results, encounter notes, upcoming appointments, etc.  Non-urgent messages can be sent to your provider as well.   To learn more about what you can do with MyChart, go to [email or web address].    Your next appointment:   12 month(s)  The format for your next appointment:   In Person  Provider:   Kirk Ruths MD

## 2022-02-26 DIAGNOSIS — M25532 Pain in left wrist: Secondary | ICD-10-CM | POA: Diagnosis not present

## 2022-02-26 DIAGNOSIS — E7800 Pure hypercholesterolemia, unspecified: Secondary | ICD-10-CM | POA: Diagnosis not present

## 2022-02-27 ENCOUNTER — Other Ambulatory Visit: Payer: Self-pay | Admitting: *Deleted

## 2022-02-27 ENCOUNTER — Encounter: Payer: Self-pay | Admitting: Cardiology

## 2022-02-27 DIAGNOSIS — E7800 Pure hypercholesterolemia, unspecified: Secondary | ICD-10-CM

## 2022-02-27 LAB — LIPOPROTEIN A (LPA): Lipoprotein (a): 214.2 nmol/L — ABNORMAL HIGH (ref <75.0)

## 2022-03-05 DIAGNOSIS — S63502A Unspecified sprain of left wrist, initial encounter: Secondary | ICD-10-CM | POA: Diagnosis not present

## 2022-03-07 ENCOUNTER — Ambulatory Visit (HOSPITAL_COMMUNITY)
Admission: RE | Admit: 2022-03-07 | Discharge: 2022-03-07 | Disposition: A | Discharge status: Home / Self Care | Payer: BC Managed Care – PPO | Source: Ambulatory Visit | Attending: Cardiology | Admitting: Cardiology

## 2022-03-07 DIAGNOSIS — R0989 Other specified symptoms and signs involving the circulatory and respiratory systems: Secondary | ICD-10-CM | POA: Diagnosis not present

## 2022-03-28 DIAGNOSIS — R350 Frequency of micturition: Secondary | ICD-10-CM | POA: Diagnosis not present

## 2022-03-28 DIAGNOSIS — R3912 Poor urinary stream: Secondary | ICD-10-CM | POA: Diagnosis not present

## 2022-04-02 NOTE — Assessment & Plan Note (Addendum)
Assessment:  LDL goal: < 55 mg/dl last LDLc 80 (60/7371)GG(Y) 214 (02/27/2022)  Other risk factors- Family history of premature CVD, CAD, Elevated CAC score  Tolerates Zetia and high intensity statins well without any side effects  Discussed next  option PCSK-9 inhibitors which helps lowering Lp(a); cost, dosing efficacy, administration and side effects   Plan: Continue taking current medications (Crestor 40 mg and Zetia 10 mg) Will apply for PA for PCSK9i; will inform patient upon approval (prefers MyChart message) Lipid lab due in 2-3 months after starting [facility]

## 2022-04-02 NOTE — Progress Notes (Signed)
Patient ID: AXSEL MIKLE                 DOB: [birth year]                    MRN: [id number]      HPI: Kenneth SOTELLO is a 66 y.o. male patient referred to lipid clinic by Dr.Crenshaw. PMH is significant for CAD, epilepsy and HDL.  Patient here for lipid management. Patient will be going for updated CAC scoring test next week. Recent Lp(a) is extremely elevated. Patient reports his both side family had CVD. See below for detailed family history. Patient takes Crestor 40 mg with Zetia 10 mg regularly and tolerates it well without side effects. Today we reviewed next step medication which will help lowering Lp(a) and LDLc. Discussed mechanisms of action, dosing, side effects and potential decreases in LDL and Lp(a).  Also reviewed cost information and potential options for patient assistance.  Current Medications: Crestor 40 mg and Zetia 10 mg  Intolerances: Pravastatin 40 mg- dizziness and Acne; atorvastatin 40 mg - insufficient response  Risk Factors: CAD, CAC score 86 (in 82%) 11/2011 Lp(a) 214 (02/27/2022). Family history of premature CVD  LDL goal: <55 mg/dl Last LDLc 80 (on 12/27/21) Diet: relatively eat healthy  Exercise: regularly 2-3 days a week cardio alternate with resistance.  Family History: premature heart disease in both side of the family Father- MI at age 15, bypass age of 72 mom -stroke, had pace maker  Father side grandmother- stroke  Mother side grand father died of MI in his 84   Social History:  Alcohol: coupe drinks per week Smoking: never  Recreational drug use : none   Labs: recent lab from PCP (Sept 2023)  most recent labs Goal  Total Cholesterol 168 < 200  Triglycerides 75 < 150  HDL 73 > 40  LDL  80 < 55   Lipid Panel     Component Value Date/Time   CHOL 212 (H) 03/12/2012 0815   TRIG 107.0 03/12/2012 0815   HDL 71.70 03/12/2012 0815   CHOLHDL 3 03/12/2012 0815   VLDL 21.4 03/12/2012 0815   LDLDIRECT 121.2 03/12/2012 0815    Past Medical  History:  Diagnosis Date   Blindness of right eye    childhood incident   CAD (coronary artery disease)    Hyperlipidemia    Seizures (HCC)     Current Outpatient Medications on File Prior to Visit  Medication Sig Dispense Refill   aspirin 81 MG chewable tablet Chew 81 mg by mouth daily.     carbamazepine (TEGRETOL XR) 200 MG 12 hr tablet Take 2 tablets (434m) in the am and 3 tablets (60107m in the pm 450 tablet 3   ezetimibe (ZETIA) 10 MG tablet Take 10 mg by mouth daily.     Rosuvastatin Calcium 40 MG CPSP Take 1 tablet by mouth daily.     tamsulosin (FLOMAX) 0.4 MG CAPS capsule Take 0.8 mg by mouth daily.     No current facility-administered medications on file prior to visit.    Allergies  Allergen Reactions   Pravastatin     Dizziness; acne    Problem  Hyperlipidemia   Current Medications: Crestor 40 mg and Zetia 10 mg  Intolerances: Pravastatin 40 mg- dizziness and Acne; atorvastatin 40 mg - insufficient response  Risk Factors: CAD, CAC score 86 (in 82%) 11/2011 Lp(a) 214 (02/27/2022). Family history of premature CVD  LDL goal: <55 mg/dl Last LDLc: 80 (  on 12/27/21) Last Lp(a): 214 (02/27/2022)    Hyperlipidemia Assessment:  LDL goal: < 55 mg/dl last LDLc 80 (12/2021)Lp(a) 214 (02/27/2022)  Other risk factors- Family history of premature CVD, CAD, Elevated CAC score  Tolerates Zetia and high intensity statins well without any side effects  Discussed next  option PCSK-9 inhibitors which helps lowering Lp(a); cost, dosing efficacy, administration and side effects   Plan: Continue taking current medications (Crestor 40 mg and Zetia 10 mg) Will apply for PA for PCSK9i; will inform patient upon approval (prefers MyChart message) Lipid lab due in 2-3 months after starting PCSK9i    Thank you,  Cammy Copa, Pharm.D [facility] of [facility] [address] [place], [place] [address]  Phone: [phone]; Fax: [phone]

## 2022-04-02 NOTE — Patient Instructions (Incomplete)
Your Results:             Your most recent labs Goal  Total Cholesterol 168 < 200  Triglycerides 75 < 150  HDL (happy/good cholesterol) 73 > 40  LDL (lousy/bad cholesterol 82 < 55   Medication changes: We will start the process to get PCSK9i (Repatha or Praluent) covered by your insurance.  Once the prior authorization is complete, we will call you to let you know and confirm pharmacy information.      Praluent is a cholesterol medication that improved your body's ability to get rid of "bad cholesterol" known as LDL. It can lower your LDL up to 60%. It is an injection that is given under the skin every 2 weeks. The most common side effects of Praluent include runny nose, symptoms of the common cold, rarely flu or flu-like symptoms, back/muscle pain in about 3-4% of the patients, and redness, pain, or bruising at the injection site.    Repatha is a cholesterol medication that improved your body's ability to get rid of "bad cholesterol" known as LDL. It can lower your LDL up to 60%! It is an injection that is given under the skin every 2 weeks. The most common side effects of Repatha include runny nose, symptoms of the common cold, rarely flu or flu-like symptoms, back/muscle pain in about 3-4% of the patients, and redness, pain, or bruising at the injection site.   Lab orders: We want to repeat labs after 2-3 months.  We will send you a lab order to remind you once we get closer to that time.

## 2022-04-03 ENCOUNTER — Ambulatory Visit: Payer: BC Managed Care – PPO | Attending: Cardiovascular Disease | Admitting: Student

## 2022-04-03 DIAGNOSIS — E782 Mixed hyperlipidemia: Secondary | ICD-10-CM

## 2022-04-04 ENCOUNTER — Telehealth: Payer: Self-pay | Admitting: Pharmacist

## 2022-04-04 DIAGNOSIS — E782 Mixed hyperlipidemia: Secondary | ICD-10-CM

## 2022-04-04 NOTE — Telephone Encounter (Addendum)
Applied for [employer] (Key: [id number]) approved

## 2022-04-05 DIAGNOSIS — Z4420 Encounter for fitting and adjustment of artificial eye, unspecified: Secondary | ICD-10-CM | POA: Diagnosis not present

## 2022-04-08 MED ORDER — REPATHA SURECLICK 140 MG/ML ~~LOC~~ SOAJ: 140.0000 mg | SUBCUTANEOUS | 3 refills | Status: DC

## 2022-04-09 ENCOUNTER — Encounter: Payer: Self-pay | Admitting: Cardiology

## 2022-04-09 DIAGNOSIS — S63502A Unspecified sprain of left wrist, initial encounter: Secondary | ICD-10-CM | POA: Diagnosis not present

## 2022-04-09 DIAGNOSIS — R0989 Other specified symptoms and signs involving the circulatory and respiratory systems: Secondary | ICD-10-CM

## 2022-04-12 ENCOUNTER — Encounter: Payer: Self-pay | Admitting: Cardiology

## 2022-04-12 ENCOUNTER — Ambulatory Visit (HOSPITAL_BASED_OUTPATIENT_CLINIC_OR_DEPARTMENT_OTHER)
Admission: RE | Admit: 2022-04-12 | Discharge: 2022-04-12 | Disposition: A | Discharge status: Home / Self Care | Payer: BC Managed Care – PPO | Source: Ambulatory Visit | Attending: Cardiology | Admitting: Cardiology

## 2022-04-12 DIAGNOSIS — I25700 Atherosclerosis of coronary artery bypass graft(s), unspecified, with unstable angina pectoris: Secondary | ICD-10-CM | POA: Insufficient documentation

## 2022-04-25 ENCOUNTER — Ambulatory Visit (HOSPITAL_COMMUNITY): Admission: RE | Admit: 2022-04-25 | Payer: BC Managed Care – PPO | Source: Ambulatory Visit

## 2022-04-26 DIAGNOSIS — Z6823 Body mass index (BMI) 23.0-23.9, adult: Secondary | ICD-10-CM | POA: Diagnosis not present

## 2022-04-26 DIAGNOSIS — J0100 Acute maxillary sinusitis, unspecified: Secondary | ICD-10-CM | POA: Diagnosis not present

## 2022-05-02 ENCOUNTER — Ambulatory Visit (HOSPITAL_COMMUNITY)
Admission: RE | Admit: 2022-05-02 | Discharge: 2022-05-02 | Disposition: A | Discharge status: Home / Self Care | Payer: BC Managed Care – PPO | Source: Ambulatory Visit | Attending: Internal Medicine | Admitting: Internal Medicine

## 2022-05-02 DIAGNOSIS — R0989 Other specified symptoms and signs involving the circulatory and respiratory systems: Secondary | ICD-10-CM | POA: Diagnosis not present

## 2022-05-07 DIAGNOSIS — Z23 Encounter for immunization: Secondary | ICD-10-CM | POA: Diagnosis not present

## 2022-07-02 DIAGNOSIS — E782 Mixed hyperlipidemia: Secondary | ICD-10-CM | POA: Diagnosis not present

## 2022-07-02 LAB — LIPID PANEL
Chol/HDL Ratio: 1.4 ratio (ref 0.0–5.0)
Cholesterol, Total: 124 mg/dL (ref 100–199)
HDL: 91 mg/dL (ref >39)
LDL Chol Calc (NIH): 21 mg/dL (ref 0–99)
Triglycerides: 49 mg/dL (ref 0–149)
VLDL Cholesterol Cal: 12 mg/dL (ref 5–40)

## 2022-07-03 LAB — LIPOPROTEIN A (LPA): Lipoprotein (a): 180.9 nmol/L — ABNORMAL HIGH (ref <75.0)

## 2022-07-11 DIAGNOSIS — H938X3 Other specified disorders of ear, bilateral: Secondary | ICD-10-CM | POA: Diagnosis not present

## 2022-08-22 DIAGNOSIS — H903 Sensorineural hearing loss, bilateral: Secondary | ICD-10-CM | POA: Diagnosis not present

## 2022-09-12 NOTE — Patient Instructions (Signed)
Below is our plan:  We will continue current treatment plan. We will update labs, today.   Please make sure you are consistent with timing of seizure medication. I recommend annual visit with primary care provider (PCP) for complete physical and routine blood work. I recommend daily intake of vitamin D (400-800iu) and calcium (800-1000mg ) for bone health. Discuss Dexa screening with PCP.   According to [place] law, you can not drive unless you are seizure / syncope free for at least 6 months and under physician's care.  Please maintain precautions. Do not participate in activities where a loss of awareness could harm you or someone else. No swimming alone, no tub bathing, no hot tubs, no driving, no operating motorized vehicles (cars, ATVs, motocycles, etc), lawnmowers, power tools or firearms. No standing at heights, such as rooftops, ladders or stairs. Avoid hot objects such as stoves, heaters, open fires. Wear a helmet when riding a bicycle, scooter, skateboard, etc. and avoid areas of traffic. Set your water heater to 120 degrees or less.  Please make sure you are staying well hydrated. I recommend 50-60 ounces daily. Well balanced diet and regular exercise encouraged. Consistent sleep schedule with 6-8 hours recommended.   Please continue follow up with care team as directed.   Follow up with me in 1 year  You may receive a survey regarding today's visit. I encourage you to leave honest feed back as I do use this information to improve patient care. Thank you for seeing me today!

## 2022-09-12 NOTE — Progress Notes (Signed)
PATIENT: Kenneth Olson DOB: [birth year]  REASON FOR VISIT: follow up HISTORY FROM: patient  Chief Complaint  Patient presents with   Follow-up    Pt in room 1. Here for seizure follow up. No recent seizures.    HISTORY OF PRESENT ILLNESS:  09/18/22 ALL: Kenneth Olson returns for follow up for seizures. He continues cbz 400/600. He is tolerating it well. No seizure activity. He remains active. Drives without difficulty. He was recently started on Repatha for elevated LPA. LDL 21 in 06/2022. He is followed by lipid clinic. Family history of cardiovascular disease, CVA and dementia. He is followed by PCP regularly.   09/12/2021 ALL: Kenneth Olson returns for follow up for seizures. He continues carbamazepine 400/600. He is tolerating medication. No obvious adverse effects. No seizure activity. He was seen for CPE with PCP 11/2020. He reports feeling well, today.   09/11/2020 ALL:  Kenneth Olson returns for follow up for seizures. He continues carbamazepine 400/600mg . He is tolerating well. No recent seizures. He was seen by ER last week for dehydration due to acute febrile illness. Workup was unremarkable with exception of concerns for right base infiltrate. He was treated with IVF and IV abx. He was able to eat and discharged as he was feeling better. Na 129, creatinine 1.32. Since, he continues to feel tired and dizzy. Symptoms worsen the more active he is. He was able to trim shubs this weekend but has been wiped out since. He is planning f/u with PCP this week.     He denies seizure activity. He continues to have fatigue and feeling exhausted He has quit his job effective 09/19/20. He admits that anxiety continues to be a concern. He is hoping he will feel better after taking some time off.   03/01/2019 ALL:  Kenneth Olson is a 67 y.o. male here today for follow up for seizure. He continues Tegretol 400mg  in am and 600mg  in pm. Last seizure over 10 years ago. He is feeling well and without complaints. He  exercises regularly. He is followed by PCP for HLD on atorvastatin. He has labs last in 10/2018 reviewed in office today. Na WNL.   HISTORY: (copied from Dr Trevor Mace note on 01/21/2018)  Interval History 10/22019: Extremely well controlled, compliant, no seizures, no side effects. Discussed epilepsy, he has been seizure free for over 10 years, takes medications never misses, no side effects. He sees his pcp regularly. Continue AEDs will release back to pcp and have them refill med every year.    HPI:  Kenneth Olson is a 67 y.o. male here as a referral from Dr. Wylene Simmer for seizures. Past medical history of partial seizures, syncope. Partial seizures without and with generalization. Partial seizures started in his 3s, thought he was passing out, he had a complete workup and no etiology found. Once he was placed on Tegretol he had no more episodes. Last episode was 10 years ago. He knows when the seizures are coming, feels lightheaded like he is going to pass out, he has time to pull over in the car or sit down. He loses consciousness and then they generalize with bilateral shaking, last a few minutes, denies post-ictal confusion but always vomits, is tired afterwards. No tongue biting or urinating. No head trauma or inciting event. No Fhx of seizures.  Has had DEXA  And was fine. No other focal neurologic deficits, associated symptoms, inciting events or modifiable factors.      Reviewed notes, labs and imaging from outside  physicians, which showed:   MRI 01/2008:   Findings: There is no evidence for acute infarction, intracranial hemorrhage, mass lesion, hydrocephalus, or extra-axial fluid. There may be mild atrophy.  There does appear to be mild small vessel disease.  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 There is mild chronic sinus disease.  The  calvarium is intact. Pituitary and cerebellar tonsils are unremarkable.   Post infusion, there is no abnormal enhancement of the brain  or meninges.  The major intracranial vascular structures appear widely Patent.   EEG 2011   IMPRESSION: Prosthetic globe unremarkable, right orbit.   No abnormal intracranial enhancement and no acute intracranial Findings.   [facility] [place] Dr.[name], patient was last seen in October 2016 diagnosed with partial epilepsy. Previous to that he was seen in 2012. He is on Tegretol 200 mg 2 pills in the morning and 3 pills at night. Had dizziness on higher doses. He reported no seizures or fainting. He has a past medical history of dizziness. Higher doses of Carbatrol made him dizzy. No additional problems except fatiguing. He was started on cholesterol several years prior. Exercising 3 times per week and no medical changes. Neurologic exam was nonfocal. He was under control, carbamazepine level and vitamin D and CBC and metabolic panel were checked.   Tegretol level 01/2015 11.6  note on 01/21/2018)  REVIEW OF SYSTEMS: Out of a complete 14 system review of symptoms, the patient complains only of the following symptoms, none and all other reviewed systems are negative.  ALLERGIES: Allergies  Allergen Reactions   Pravastatin     Dizziness; acne    HOME MEDICATIONS: Outpatient Medications Prior to Visit  Medication Sig Dispense Refill   aspirin 81 MG chewable tablet Chew 81 mg by mouth daily.     Evolocumab (REPATHA SURECLICK) 140 MG/ML SOAJ Inject 140 mg into the skin every 14 (fourteen) days. 6 mL 3   ezetimibe (ZETIA) 10 MG tablet Take 10 mg by mouth daily.     Rosuvastatin Calcium 40 MG CPSP Take 1 tablet by mouth daily.     tamsulosin (FLOMAX) 0.4 MG CAPS capsule Take 0.8 mg by mouth daily.     carbamazepine (TEGRETOL XR) 200 MG 12 hr tablet Take 2 tablets (400mg ) in the am and 3 tablets (600mg ) in the pm 450 tablet 3   No facility-administered medications prior to visit.    PAST MEDICAL HISTORY: Past Medical History:  Diagnosis Date    Blindness of right eye    childhood incident   CAD (coronary artery disease)    Hyperlipidemia    Seizures (HCC)     PAST SURGICAL HISTORY: Past Surgical History:  Procedure Laterality Date   ANKLE SURGERY     EYE SURGERY     TONSILLECTOMY      FAMILY HISTORY: Family History  Problem Relation Age of Onset   Stroke Mother    Heart attack Father        MI at age 18   Heart disease Father    Seizures Neg Hx     SOCIAL HISTORY: Social History   Socioeconomic History   Marital status: Married    Spouse name: Not on file   Number of children: 2   Years of education: Not on file   Highest education level: Bachelor's degree (e.g., BA, AB, BS)  Occupational History    Comment: IT consultant  Tobacco Use   Smoking status: Never   Smokeless tobacco:  Never  Vaping Use   Vaping Use: Never used  Substance and Sexual Activity   Alcohol use: Yes    Comment: Occasional   Drug use: No   Sexual activity: Yes  Other Topics Concern   Not on file  Social History Narrative   Lives at home with his wife   Right handed   Caffeine: 4 cups daily   Social Determinants of Health   Financial Resource Strain: Not on file  Food Insecurity: Not on file  Transportation Needs: Not on file  Physical Activity: Not on file  Stress: Not on file  Social Connections: Not on file  Intimate Partner Violence: Not on file      PHYSICAL EXAM  Vitals:   09/18/22 0920  BP: 120/61  Pulse: 63  Weight: 157 lb 3.2 oz (71.3 kg)  Height: 5\' 6"  (1.676 m)     Body mass index is 25.37 kg/m.  Generalized: Well developed, in no acute distress  Cardiology: normal rate and rhythm, no murmur noted Neurological examination  Mentation: Alert oriented to time, place, history taking. Follows all commands speech and language fluent Cranial nerve II-XII: Pupils were equal round reactive to light. Extraocular movements were full, visual field were full on confrontational test. Facial sensation and  strength were normal. Uvula tongue midline. Head turning and shoulder shrug  were normal and symmetric. Motor: The motor testing reveals 5 over 5 strength of all 4 extremities. Good symmetric motor tone is noted throughout.  Sensory: Sensory testing is intact to soft touch on all 4 extremities. No evidence of extinction is noted.  Coordination: Cerebellar testing reveals good finger-nose-finger and heel-to-shin bilaterally.  Gait and station: Gait is normal.   DIAGNOSTIC DATA (LABS, IMAGING, TESTING) - I reviewed patient records, labs, notes, testing and imaging myself where available.      No data to display           Lab Results  Component Value Date   WBC 13.1 (H) 08/29/2020   HGB 14.5 08/29/2020   HCT 43.2 08/29/2020   MCV 99.3 08/29/2020   PLT 279 08/29/2020      Component Value Date/Time   NA 138 09/12/2021 0954   K 4.2 09/12/2021 0954   CL 103 09/12/2021 0954   CO2 24 09/12/2021 0954   GLUCOSE 90 09/12/2021 0954   GLUCOSE 120 (H) 08/29/2020 1231   BUN 15 09/12/2021 0954   CREATININE 1.13 09/12/2021 0954   CALCIUM 9.3 09/12/2021 0954   PROT 6.3 09/12/2021 0954   ALBUMIN 4.5 09/12/2021 0954   AST 29 09/12/2021 0954   ALT 41 09/12/2021 0954   ALKPHOS 92 09/12/2021 0954   BILITOT 0.3 09/12/2021 0954   GFRNONAA >60 08/29/2020 1231   GFRAA >60 10/11/2019 0937   Lab Results  Component Value Date   CHOL 124 07/02/2022   HDL 91 07/02/2022   LDLCALC 21 07/02/2022   LDLDIRECT 121.2 03/12/2012   TRIG 49 07/02/2022   CHOLHDL 1.4 07/02/2022   No results found for: "HGBA1C" No results found for: "VITAMINB12" No results found for: "TSH"     ASSESSMENT AND PLAN 67 y.o. year old male  has a past medical history of Blindness of right eye, CAD (coronary artery disease), Hyperlipidemia, and Seizures (HCC). here with     ICD-10-CM   1. Partial idiopathic epilepsy with seizures of localized onset, not intractable, without status epilepticus (HCC)  G40.009 Carbamazepine  level, total    CMP      Kenneth Olson is  doing well from a seizure perspective.  He is tolerating carbamazepine with no obvious adverse effects. He will continue carbamazepine 400mg  in am and 600mg  in pm. He will continue close follow-up with PCP for annual wellness and management of cholesterol.  Regular exercise and well-balanced diet advised.  He will follow-up with me in 1 year, sooner if needed. He verbalizes understanding and agreement with this plan.    Orders Placed This Encounter  Procedures   Carbamazepine level, total   CMP     Meds ordered this encounter  Medications   carbamazepine (TEGRETOL XR) 200 MG 12 hr tablet    Sig: Take 2 tablets (400mg ) in the am and 3 tablets (600mg ) in the pm    Dispense:  450 tablet    Refill:  3    Order Specific Question:   Supervising Provider    Answer:   Bernestine Amass, FNP-C 09/18/2022, 9:57 AM [facility] [address] [place], [place] [address] [phone]   [facility]

## 2022-09-18 ENCOUNTER — Encounter: Payer: Self-pay | Admitting: Family Medicine

## 2022-09-18 ENCOUNTER — Ambulatory Visit: Payer: BC Managed Care – PPO | Admitting: Family Medicine

## 2022-09-18 VITALS — BP 120/61 | HR 63 | Ht 66.0 in | Wt 157.2 lb

## 2022-09-18 DIAGNOSIS — G40009 Localization-related (focal) (partial) idiopathic epilepsy and epileptic syndromes with seizures of localized onset, not intractable, without status epilepticus: Secondary | ICD-10-CM | POA: Diagnosis not present

## 2022-09-18 MED ORDER — CARBAMAZEPINE ER 200 MG PO TB12: ORAL_TABLET | ORAL | 3 refills | Status: DC

## 2022-09-19 LAB — COMPREHENSIVE METABOLIC PANEL
ALT: 33 IU/L (ref 0–44)
AST: 33 IU/L (ref 0–40)
Albumin/Globulin Ratio: 2.2 (ref 1.2–2.2)
Albumin: 4.4 g/dL (ref 3.9–4.9)
Alkaline Phosphatase: 105 IU/L (ref 44–121)
BUN/Creatinine Ratio: 15 (ref 10–24)
BUN: 17 mg/dL (ref 8–27)
Bilirubin Total: 0.3 mg/dL (ref 0.0–1.2)
CO2: 22 mmol/L (ref 20–29)
Calcium: 9.3 mg/dL (ref 8.6–10.2)
Chloride: 104 mmol/L (ref 96–106)
Creatinine, Ser: 1.13 mg/dL (ref 0.76–1.27)
Globulin, Total: 2 g/dL (ref 1.5–4.5)
Glucose: 70 mg/dL (ref 70–99)
Potassium: 4.6 mmol/L (ref 3.5–5.2)
Sodium: 143 mmol/L (ref 134–144)
Total Protein: 6.4 g/dL (ref 6.0–8.5)
eGFR: 73 mL/min/{1.73_m2} (ref >59)

## 2022-09-19 LAB — CARBAMAZEPINE LEVEL, TOTAL: Carbamazepine (Tegretol), S: 9.2 ug/mL (ref 4.0–12.0)

## 2022-12-31 DIAGNOSIS — Z1212 Encounter for screening for malignant neoplasm of rectum: Secondary | ICD-10-CM | POA: Diagnosis not present

## 2023-01-10 DIAGNOSIS — R82998 Other abnormal findings in urine: Secondary | ICD-10-CM | POA: Diagnosis not present

## 2023-02-03 ENCOUNTER — Other Ambulatory Visit: Payer: Self-pay | Admitting: *Deleted

## 2023-03-04 ENCOUNTER — Ambulatory Visit: Payer: Medicare Other | Attending: Nurse Practitioner | Admitting: Nurse Practitioner

## 2023-03-04 ENCOUNTER — Encounter: Payer: Self-pay | Admitting: Nurse Practitioner

## 2023-03-04 VITALS — BP 118/74 | HR 58 | Ht 66.0 in | Wt 159.0 lb

## 2023-03-04 DIAGNOSIS — R0989 Other specified symptoms and signs involving the circulatory and respiratory systems: Secondary | ICD-10-CM | POA: Diagnosis not present

## 2023-03-04 DIAGNOSIS — I10 Essential (primary) hypertension: Secondary | ICD-10-CM | POA: Diagnosis present

## 2023-03-04 DIAGNOSIS — E785 Hyperlipidemia, unspecified: Secondary | ICD-10-CM | POA: Insufficient documentation

## 2023-03-04 DIAGNOSIS — I2510 Atherosclerotic heart disease of native coronary artery without angina pectoris: Secondary | ICD-10-CM | POA: Diagnosis not present

## 2023-03-04 NOTE — Patient Instructions (Signed)
Medication Instructions:  Your physician recommends that you continue on your current medications as directed. Please refer to the Current Medication list given to you today.  *If you need a refill on your cardiac medications before your next appointment, please call your pharmacy*   Lab Work: NONE ordered at this time of appointment   Testing/Procedures: NONE ordered at this time of appointment     Follow-Up: At [facility], you and your health needs are our priority.  As part of our continuing mission to provide you with exceptional heart care, we have created designated Provider Care Teams.  These Care Teams include your primary Cardiologist (physician) and Advanced Practice Providers (APPs -  Physician Assistants and Nurse Practitioners) who all work together to provide you with the care you need, when you need it.  We recommend signing up for the patient portal called "MyChart".  Sign up information is provided on this After Visit Summary.  MyChart is used to connect with patients for Virtual Visits (Telemedicine).  Patients are able to view lab/test results, encounter notes, upcoming appointments, etc.  Non-urgent messages can be sent to your provider as well.   To learn more about what you can do with MyChart, go to [email or web address].    Your next appointment:   1 year(s)  Provider:   Olga Millers, MD     Other Instructions

## 2023-03-04 NOTE — Progress Notes (Signed)
Office Visit    Patient Name: Kenneth Olson Date of Encounter: 03/04/2023  Primary Care Provider:  Gaspar Garbe, MD Primary Cardiologist:  Olga Millers, MD  Chief Complaint    67 year old male with a history of CAD, hypertension, hyperlipidemia, abdominal bruit without evidence of AAA who presents for follow-up related.  Past Medical History    Past Medical History:  Diagnosis Date   Blindness of right eye    childhood incident   CAD (coronary artery disease)    Hyperlipidemia    Seizures (HCC)    Past Surgical History:  Procedure Laterality Date   ANKLE SURGERY     EYE SURGERY     TONSILLECTOMY      Allergies  Allergies  Allergen Reactions   Pravastatin     Dizziness; acne     Labs/Other Studies Reviewed    The following studies were reviewed today:  Cardiac Studies & Procedures          CT SCANS  CT CARDIAC SCORING (SELF PAY ONLY) 04/12/2022  Addendum 04/12/2022 10:25 AM ADDENDUM REPORT: 04/12/2022 10:22  EXAM: OVER-READ INTERPRETATION  CT CHEST  The following report is an over-read performed by radiologist Dr. Darlys Gales [facility] on 04/12/2022. This over-read does not include interpretation of cardiac or coronary anatomy or pathology. The coronary calcium score interpretation by the cardiologist is attached.  COMPARISON:  CT 12/15/2011  FINDINGS: Vascular: No significant extracardiac vascular findings.  Mediastinum/Nodes: No lymphadenopathy.  Lungs/Pleura: There are scattered 2 and 3 mm pulmonary nodules in the left lower lobe and right lower lobe, which are all stable since August 2013 and benign. There is a 3 mm pulmonary nodule in the right middle lobe, not seen on prior exams (series 2, image 31). No focal airspace consolidation.  Upper Abdomen: No acute abnormality.  Musculoskeletal: There is a small benign bone island in the posterior aspect of the T7 vertebral body. No acute osseous abnormality. No  suspicious osseous lesion.  IMPRESSION: Scattered 2-3 mm pulmonary nodules in the lower lobes, which are stable since August 2013 and benign. There is an additional 3 mm pulmonary nodule in the right middle lobe, not seen on prior exams.  Per Fleischner Society Guidelines, no routine follow-up imaging is recommended. These guidelines do not apply to immunocompromised patients and patients with cancer. Follow up in patients with significant comorbidities as clinically warranted. For lung cancer screening, adhere to Lung-RADS guidelines. Reference: Radiology. 2017; 284(1):228-43.   Electronically Signed By: Caprice Renshaw M.D. On: 04/12/2022 10:22  Narrative CLINICAL DATA:  Cardiovascular disease risk stratification  CAD screening, low CAD risk  EXAM: CT Coronary Calcium Score  TECHNIQUE: A gated, non-contrast computed tomography scan of the heart was performed using 3mm slice thickness. Axial images were analyzed on a dedicated workstation. Calcium scoring of the coronary arteries was performed using the Agatston method.  FINDINGS: Coronary Calcium Score:  Left main: 0  Left anterior descending artery: 194  Left circumflex artery: 0  Right coronary artery: 15.7  Total: 210  Percentile: 70th  Pericardium: Normal.  Ascending Aorta: Normal caliber. Ascending aorta measures approximately 32mm at the mid ascending aorta measured in a non-contrast axial plane.  Non-cardiac: See separate report from [facility].  IMPRESSION: Coronary calcium score of 210. This was 70th percentile for age-, race-, and sex-matched controls.  RECOMMENDATIONS: Coronary artery calcium (CAC) score is a strong predictor of incident coronary heart disease (CHD) and provides predictive information beyond traditional risk factors. CAC scoring  is reasonable to use in the decision to withhold, postpone, or initiate statin therapy in intermediate-risk or selected  borderline-risk asymptomatic adults (age 36-75 years and LDL-C >=70 to <190 mg/dL) who do not have diabetes or established atherosclerotic cardiovascular disease (ASCVD).* In intermediate-risk (10-year ASCVD risk >=7.5% to <20%) adults or selected borderline-risk (10-year ASCVD risk >=5% to <7.5%) adults in whom a CAC score is measured for the purpose of making a treatment decision the following recommendations have been made:  If CAC=0, it is reasonable to withhold statin therapy and reassess in 5 to 10 years, as long as higher risk conditions are absent (diabetes mellitus, family history of premature CHD in first degree relatives (males <55 years; females <65 years), cigarette smoking, or LDL >=190 mg/dL).  If CAC is 1 to 99, it is reasonable to initiate statin therapy for patients >=63 years of age.  If CAC is >=100 or >=75th percentile, it is reasonable to initiate statin therapy at any age.  Cardiology referral should be considered for patients with CAC scores >=400 or >=75th percentile.  *2018 AHA/ACC/AACVPR/AAPA/ABC/ACPM/ADA/AGS/APhA/ASPC/NLA/PCNA Guideline on the Management of Blood Cholesterol: A Report of the American College of Cardiology/American Heart Association Task Force on Clinical Practice Guidelines. J Am Coll Cardiol. 2019;73(24):3168-3209.  Electronically Signed: By: Weston Brass M.D. On: 04/12/2022 09:55   CT SCANS  CT CORONARY MORPH W/CTA COR W/SCORE 12/15/2011  Narrative *RADIOLOGY REPORT*  INDICATION:  67 year-old male with chest pain.  CT ANGIOGRAPHY OF THE HEART, CORONARY ARTERY, STRUCTURE, AND MORPHOLOGY  CONTRAST: 80mL OMNIPAQUE IOHEXOL 350 MG/ML SOLN  COMPARISON:  Chest radiograph 12/14/2011  TECHNIQUE:  CT angiography of the coronary vessels was performed on a 256 channel system using prospective ECG gating.  A scout and noncontrast exam (for calcium scoring) were performed.  Circulation time was measured using a test bolus.   Coronary CTA was performed with sub mm slice collimation during portions of the cardiac cycle after prior injection of iodinated contrast.  Imaging post processing was performed on an independent workstation creating multiplanar and 3-D images, and quantitative analysis of the heart and coronary arteries.  Note that this exam targets the heart and the chest was not imaged in its entirety.  PREMEDICATION: Lopressor 50 mg, P.O. Nitroglycerin 0.4 mcg, sublingual.  FINDINGS: Technical quality:  Good  Heart rate:  57 beats per minute  CORONARY ARTERIES: Left main coronary artery:  Normal location and widely patent. Left anterior descending:  There are foci of calcified plaque involving the LAD.  There is mild narrowing of the distal LAD associated with calcified and noncalcified plaque.  This appears to be less than 50% narrowing.  There are small diagonal branches. Left circumflex Left circumflex:  Patent with a prominent obtuse marginal branch. Right coronary artery:  Small amount of calcified plaque without occlusive disease. Posterior descending artery:  Normal. Dominance:  Right  CORONARY CALCIUM: Total Agatston Score:  86.4 MESA database percentile:  82%  CARDIAC MEASUREMENTS: Interventricular septum (6 - 12 mm):  9 mm LV posterior wall (6 - 12 mm):  13 mm LV diameter in diastole (35 - 52 mm):  33 mm  AORTA AND PULMONARY MEASUREMENTS: Aortic root (21 - 40 mm):  28 mm  at the annulus 35 mm  at the sinuses of Valsalva 25 mm  at the sinotubular junction Ascending aorta ( <  40 mm):  26 mm Descending aorta ( <  40 mm):  20 mm Main pulmonary artery:  ( <  30 mm):  23 mm  EXTRACARDIAC FINDINGS: Small amount of fluid within the pericardial recess.  No evidence for chest lymphadenopathy.  No evidence for a central pulmonary embolism.  There is a tiny subpleural nodule measuring 3 mm on sequence #4, image 8.  2 mm peripheral nodule in the left lower lobe on image 14,  sequence #4. Otherwise, the visualized lungs are clear  IMPRESSION:  1.  Nonocclusive coronary artery disease.  The patient's total coronary artery calcium score is 86.4, which is 82% percentile for patient's matched age and gender. 2.  They are two small left pulmonary nodules.  These findings are nonspecific. If the patient is at high risk for bronchogenic carcinoma, follow-up chest CT at 1 year is recommended.  If the patient is at low risk, no follow-up is needed.  This recommendation follows the consensus statement: Guidelines for Management of Small Pulmonary Nodules Detected on CT Scans:  A Statement from the Fleischner Society as published in Radiology 2005; 237:395-400. 3.  Right coronary artery dominance.  Report was called to CDU mid level at [phone] at 9:49 a.m. on 12/15/2011.   Original Report Authenticated By: Richarda Overlie, M.D.         Recent Labs: 09/18/2022: ALT 33; BUN 17; Creatinine, Ser 1.13; Potassium 4.6; Sodium 143  Recent Lipid Panel    Component Value Date/Time   CHOL 124 07/02/2022 0854   TRIG 49 07/02/2022 0854   HDL 91 07/02/2022 0854   CHOLHDL 1.4 07/02/2022 0854   CHOLHDL 3 03/12/2012 0815   VLDL 21.4 03/12/2012 0815   LDLCALC 21 07/02/2022 0854   LDLDIRECT 121.2 03/12/2012 0815    History of Present Illness    67 year old male with the above past medical history including CAD, hypertension, hyperlipidemia, abdominal bruit without evidence of AAA.  Coronary CTA in 2013 showed calcium score of 86.4 (82nd percentile), mild disease in the LAD and RCA, 2 small left pulmonary nodules. He was last seen in the office on 02/25/2022 and was stable from a cardiac standpoint.  He denies symptoms concerning for angina.  He was referred to lipid clinic Pharm.D. for consideration of PCSK9 inhibitor given LDL goal less than 55, he was started on Repatha. He has a family history of abdominal aortic aneurysm in his father, noted to have abdominal bruit on exam.   Abdominal ultrasound showed no evidence of AAA.  Repeat CT calcium scoring in 03/2022 revealed coronary calcium score of 210 (70th percentile). Carotid ultrasound in 04/2022 revealed no significant carotid artery stenosis.  He presents today for follow-up.  Since his last visit he has done well from a cardiac standpoint.  He denies any symptoms concerning for angina.  He exercises regularly without difficulty.  BP has been well-controlled.  Overall, he reports feeling well.  Home Medications    Current Outpatient Medications  Medication Sig Dispense Refill   aspirin 81 MG chewable tablet Chew 81 mg by mouth daily.     carbamazepine (TEGRETOL XR) 200 MG 12 hr tablet Take 2 tablets (400mg ) in the am and 3 tablets (600mg ) in the pm 450 tablet 3   Evolocumab (REPATHA SURECLICK) 140 MG/ML SOAJ Inject 140 mg into the skin every 14 (fourteen) days. 6 mL 3   ezetimibe (ZETIA) 10 MG tablet Take 10 mg by mouth daily.     Rosuvastatin Calcium 40 MG CPSP Take 1 tablet by mouth daily.     tamsulosin (FLOMAX) 0.4 MG CAPS capsule Take 0.8 mg by mouth daily.     No  current facility-administered medications for this visit.     Review of Systems    He denies chest pain, palpitations, dyspnea, pnd, orthopnea, n, v, dizziness, syncope, edema, weight gain, or early satiety. All other systems reviewed and are otherwise negative except as noted above.   Physical Exam    VS:  BP 118/74   Pulse (!) 58   Ht 5\' 6"  (1.676 m)   Wt 159 lb (72.1 kg)   SpO2 99%   BMI 25.66 kg/m   GEN: Well nourished, well developed, in no acute distress. HEENT: normal. Neck: Supple, no JVD, carotid bruits, or masses. Cardiac: RRR, no murmurs, rubs, or gallops. No clubbing, cyanosis, edema.  Radials/DP/PT 2+ and equal bilaterally.  Respiratory:  Respirations regular and unlabored, clear to auscultation bilaterally. GI: Soft, nontender, nondistended, BS + x 4. MS: no deformity or atrophy. Skin: warm and dry, no rash. Neuro:   Strength and sensation are intact. Psych: Normal affect.  Accessory Clinical Findings    ECG personally reviewed by me today - EKG Interpretation Date/Time:  Tuesday March 04 2023 09:12:59 EST Ventricular Rate:  58 PR Interval:  168 QRS Duration:  88 QT Interval:  400 QTC Calculation: 392 R Axis:   67  Text Interpretation: Sinus bradycardia Confirmed by Bernadene Person ([id number]) on 03/04/2023 9:19:28 AM  - no acute changes.   Lab Results  Component Value Date   WBC 13.1 (H) 08/29/2020   HGB 14.5 08/29/2020   HCT 43.2 08/29/2020   MCV 99.3 08/29/2020   PLT 279 08/29/2020   Lab Results  Component Value Date   CREATININE 1.13 09/18/2022   BUN 17 09/18/2022   NA 143 09/18/2022   K 4.6 09/18/2022   CL 104 09/18/2022   CO2 22 09/18/2022   Lab Results  Component Value Date   ALT 33 09/18/2022   AST 33 09/18/2022   ALKPHOS 105 09/18/2022   BILITOT 0.3 09/18/2022   Lab Results  Component Value Date   CHOL 124 07/02/2022   HDL 91 07/02/2022   LDLCALC 21 07/02/2022   LDLDIRECT 121.2 03/12/2012   TRIG 49 07/02/2022   CHOLHDL 1.4 07/02/2022    No results found for: "HGBA1C"  Assessment & Plan   1. CAD: Coronary CTA in 2013 showed calcium score of 86.4 (82nd percentile), mild disease in the LAD and RCA, 2 small left pulmonary nodules. Repeat CT calcium scoring in 03/2022 revealed coronary calcium score of 210 (70th percentile). Stable with no anginal symptoms. No indication for ischemic evaluation. Continue Aspirin, Repatha, Crestor, Zetia.   2. Hypertension: BP well controlled. History of elevated blood pressure readings, not on any antihypertensive medication at this time.   3. Hyperlipidemia: LDL was 29 in 12/2022. Continue Repatha, Crestor, Zetia.   4. Abdominal bruit: History of AAA in his father.  Abdominal bruit was noted, abdominal ultrasound showed no evidence of AAA.    5. Disposition: Follow-up in 1 year.      Joylene Grapes, NP 03/04/2023, 9:37 AM

## 2023-03-13 ENCOUNTER — Other Ambulatory Visit: Payer: Self-pay | Admitting: Cardiology

## 2023-05-05 ENCOUNTER — Encounter: Payer: Self-pay | Admitting: Cardiology

## 2023-05-07 ENCOUNTER — Telehealth: Payer: Self-pay | Admitting: Pharmacy Technician

## 2023-05-07 ENCOUNTER — Other Ambulatory Visit (HOSPITAL_COMMUNITY): Payer: Self-pay

## 2023-05-07 NOTE — Telephone Encounter (Signed)
 Pharmacy Patient Advocate Encounter   Received notification from Patient Advice Request messages that prior authorization for repatha  is required/requested.   Insurance verification completed.   The patient is insured through [facility] .   Per test claim: PA required; PA submitted to above mentioned insurance via CoverMyMeds Key/confirmation #/EOC [id number] Status is pending

## 2023-05-08 ENCOUNTER — Other Ambulatory Visit (HOSPITAL_COMMUNITY): Payer: Self-pay

## 2023-05-08 NOTE — Telephone Encounter (Signed)
Pharmacy Patient Advocate Encounter  Received notification from [facility] that Prior Authorization for repatha has been APPROVED from 05/07/23 to 11/04/23   PA #/Case ID/Reference #: [id number]

## 2023-05-08 NOTE — Telephone Encounter (Signed)
 Noted. Mychart message sent to patient.

## 2023-09-14 ENCOUNTER — Encounter: Payer: Self-pay | Admitting: Family Medicine

## 2023-09-16 ENCOUNTER — Other Ambulatory Visit: Payer: Self-pay

## 2023-09-16 MED ORDER — CARBAMAZEPINE ER 200 MG PO TB12: ORAL_TABLET | ORAL | 0 refills | Status: DC

## 2023-09-22 NOTE — Patient Instructions (Signed)
 Below is our plan:  We will continue carbamazepine  400mg  in am and 600mg  in evenings.   Please make sure you are consistent with timing of seizure medication. I recommend annual visit with primary care provider (PCP) for complete physical and routine blood work. I recommend daily intake of vitamin D (400-800iu) and calcium  (800-1000mg ) for bone health. Discuss Dexa screening with PCP.   According to [place] law, you can not drive unless you are seizure / syncope free for at least 6 months and under physician's care.  Please maintain precautions. Do not participate in activities where a loss of awareness could harm you or someone else. No swimming alone, no tub bathing, no hot tubs, no driving, no operating motorized vehicles (cars, ATVs, motocycles, etc), lawnmowers, power tools or firearms. No standing at heights, such as rooftops, ladders or stairs. Avoid hot objects such as stoves, heaters, open fires. Wear a helmet when riding a bicycle, scooter, skateboard, etc. and avoid areas of traffic. Set your water heater to 120 degrees or less.  SUDEP is the sudden, unexpected death of someone with epilepsy, who was otherwise healthy. In SUDEP cases, no other cause of death is found when an autopsy is done. Each year, more than 1 in 1,000 people with epilepsy die from SUDEP. This is the leading cause of death in people with uncontrolled seizures. Until further answers are available, the best way to prevent SUDEP is to lower your risk by controlling seizures. Research has found that people with all types of epilepsy that experience convulsive seizures can be at risk.  Please make sure you are staying well hydrated. I recommend 50-60 ounces daily. Well balanced diet and regular exercise encouraged. Consistent sleep schedule with 6-8 hours recommended.   Please continue follow up with care team as directed.   Follow up with me in 1 year   You may receive a survey regarding today's visit. I encourage you to  leave honest feed back as I do use this information to improve patient care. Thank you for seeing me today!

## 2023-09-22 NOTE — Progress Notes (Signed)
 PATIENT: Kenneth Olson DOB: 08/01/1955  REASON FOR VISIT: follow up HISTORY FROM: patient  Chief Complaint  Patient presents with   Follow-up    Pt in room 1. Alone. Here for seizure follow up.  No recent seizures, no concerns.     HISTORY OF PRESENT ILLNESS:  09/23/23 ALL: Kenneth Olson returns for follow up for seizures. He continues cbz 400/600 and tolerating well. He denies seizure activity. Last seizure was in 2009. He remains active. Independent. Drives without difficulty. He is followed closely by PCP.   09/18/2022 ALL:  Kenneth Olson returns for follow up for seizures. He continues cbz 400/600. He is tolerating it well. No seizure activity. He remains active. Drives without difficulty. He was recently started on Repatha  for elevated LPA. LDL 21 in 06/2022. He is followed by lipid clinic. Family history of cardiovascular disease, CVA and dementia. He is followed by PCP regularly.   09/12/2021 ALL: Kenneth Olson returns for follow up for seizures. He continues carbamazepine  400/600. He is tolerating medication. No obvious adverse effects. No seizure activity. He was seen for CPE with PCP 11/2020. He reports feeling well, today.   09/11/2020 ALL:  Kenneth Olson returns for follow up for seizures. He continues carbamazepine  400/600mg . He is tolerating well. No recent seizures. He was seen by ER last week for dehydration due to acute febrile illness. Workup was unremarkable with exception of concerns for right base infiltrate. He was treated with IVF and IV abx. He was able to eat and discharged as he was feeling better. Na 129, creatinine 1.32. Since, he continues to feel tired and dizzy. Symptoms worsen the more active he is. He was able to trim shubs this weekend but has been wiped out since. He is planning f/u with PCP this week.     He denies seizure activity. He continues to have fatigue and feeling exhausted He has quit his job effective 09/19/20. He admits that anxiety continues to be a concern. He is hoping he  will feel better after taking some time off.   03/01/2019 ALL:  Kenneth Olson is a 68 y.o. male here today for follow up for seizure. He continues Tegretol  400mg  in am and 600mg  in pm. Last seizure over 10 years ago. He is feeling well and without complaints. He exercises regularly. He is followed by PCP for HLD on atorvastatin . He has labs last in 10/2018 reviewed in office today. Na WNL.   HISTORY: (copied from Dr Harding Li note on 01/21/2018)  Interval History 10/22019: Extremely well controlled, compliant, no seizures, no side effects. Discussed epilepsy, he has been seizure free for over 10 years, takes medications never misses, no side effects. He sees his pcp regularly. Continue AEDs will release back to pcp and have them refill med every year.    HPI:  Kenneth Olson is a 68 y.o. male here as a referral from Dr. Tisovec for seizures. Past medical history of partial seizures, syncope. Partial seizures without and with generalization. Partial seizures started in his 71s, thought he was passing out, he had a complete workup and no etiology found. Once he was placed on Tegretol  he had no more episodes. Last episode was 10 years ago. He knows when the seizures are coming, feels lightheaded like he is going to pass out, he has time to pull over in the car or sit down. He loses consciousness and then they generalize with bilateral shaking, last a few minutes, denies post-ictal confusion but always vomits, is tired afterwards. No  tongue biting or urinating. No head trauma or inciting event. No Fhx of seizures.  Has had DEXA  And was fine. No other focal neurologic deficits, associated symptoms, inciting events or modifiable factors.      Reviewed notes, labs and imaging from outside physicians, which showed:   MRI 01/2008:   Findings: There is no evidence for acute infarction, intracranial hemorrhage, mass lesion, hydrocephalus, or extra-axial fluid. There may be mild atrophy.  There does appear to  be mild small vessel disease.  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 There is mild chronic sinus disease.  The  calvarium is intact. Pituitary and cerebellar tonsils are unremarkable.   Post infusion, there is no abnormal enhancement of the brain or meninges.  The major intracranial vascular structures appear widely Patent.   EEG 2011   IMPRESSION: Prosthetic globe unremarkable, right orbit.   No abnormal intracranial enhancement and no acute intracranial Findings.   [facility] [place] Dr.[name], patient was last seen in October 2016 diagnosed with partial epilepsy. Previous to that he was seen in 2012. He is on Tegretol  200 mg 2 pills in the morning and 3 pills at night. Had dizziness on higher doses. He reported no seizures or fainting. He has a past medical history of dizziness. Higher doses of Carbatrol  made him dizzy. No additional problems except fatiguing. He was started on cholesterol several years prior. Exercising 3 times per week and no medical changes. Neurologic exam was nonfocal. He was under control, carbamazepine  level and vitamin D and CBC and metabolic panel were checked.   Tegretol  level 01/2015 11.6  note on 01/21/2018)  REVIEW OF SYSTEMS: Out of a complete 14 system review of symptoms, the patient complains only of the following symptoms, none and all other reviewed systems are negative.  ALLERGIES: Allergies  Allergen Reactions   Pravastatin      Dizziness; acne    HOME MEDICATIONS: Outpatient Medications Prior to Visit  Medication Sig Dispense Refill   aspirin 81 MG chewable tablet Chew 81 mg by mouth daily.     Evolocumab  (REPATHA  SURECLICK) 140 MG/ML SOAJ INJECT 140 MG INTO THE SKIN EVERY 14 (FOURTEEN) DAYS. 6 mL 3   ezetimibe (ZETIA) 10 MG tablet Take 10 mg by mouth daily.     Rosuvastatin Calcium  40 MG CPSP Take 1 tablet by mouth daily.     tamsulosin (FLOMAX) 0.4 MG CAPS capsule Take 0.8 mg by mouth daily.      carbamazepine  (TEGRETOL  XR) 200 MG 12 hr tablet Take 2 tablets (400mg ) in the am and 3 tablets (600mg ) in the pm 450 tablet 0   No facility-administered medications prior to visit.    PAST MEDICAL HISTORY: Past Medical History:  Diagnosis Date   Blindness of right eye    childhood incident   CAD (coronary artery disease)    Hyperlipidemia    Seizures (HCC)     PAST SURGICAL HISTORY: Past Surgical History:  Procedure Laterality Date   ANKLE SURGERY     EYE SURGERY     TONSILLECTOMY      FAMILY HISTORY: Family History  Problem Relation Age of Onset   Stroke Mother    Heart attack Father        MI at age 63   Heart disease Father    Seizures Neg Hx     SOCIAL HISTORY: Social History   Socioeconomic History   Marital status: Married    Spouse name: Not on file  Number of children: 2   Years of education: Not on file   Highest education level: Bachelor's degree (e.g., BA, AB, BS)  Occupational History    Comment: IT consultant  Tobacco Use   Smoking status: Never   Smokeless tobacco: Never  Vaping Use   Vaping status: Never Used  Substance and Sexual Activity   Alcohol  use: Yes    Comment: Occasional   Drug use: No   Sexual activity: Yes  Other Topics Concern   Not on file  Social History Narrative   Lives at home with his wife   Right handed   Caffeine: 4 cups daily   Social Drivers of Corporate investment banker Strain: Not on file  Food Insecurity: Not on file  Transportation Needs: Not on file  Physical Activity: Not on file  Stress: Not on file  Social Connections: Unknown (07/11/2022)   Received [facility], [facility]   Social Network    Social Network: Not on file  Intimate Partner Violence: Unknown (07/11/2022)   Received from [facility], [facility]   HITS    Physically Hurt: Not on file    Insult or Talk Down To: Not on file    Threaten Physical Harm: Not on file    Scream or Curse: Not on file      PHYSICAL  EXAM  Vitals:   09/23/23 0806  BP: 115/73  Pulse: 65  SpO2: 97%  Weight: 156 lb (70.8 kg)  Height: 5\' 6"  (1.676 m)      Body mass index is 25.18 kg/m.  Generalized: Well developed, in no acute distress  Cardiology: normal rate and rhythm, no murmur noted Neurological examination  Mentation: Alert oriented to time, place, history taking. Follows all commands speech and language fluent Cranial nerve II-XII: Pupils were equal round reactive to light. Extraocular movements were full, visual field were full on confrontational test. Facial sensation and strength were normal. Uvula tongue midline. Head turning and shoulder shrug  were normal and symmetric. Motor: The motor testing reveals 5 over 5 strength of all 4 extremities. Good symmetric motor tone is noted throughout.  Sensory: Sensory testing is intact to soft touch on all 4 extremities. No evidence of extinction is noted.  Coordination: Cerebellar testing reveals good finger-nose-finger and heel-to-shin bilaterally.  Gait and station: Gait is normal.    DIAGNOSTIC DATA (LABS, IMAGING, TESTING) - I reviewed patient records, labs, notes, testing and imaging myself where available.      No data to display           Lab Results  Component Value Date   WBC 13.1 (H) 08/29/2020   HGB 14.5 08/29/2020   HCT 43.2 08/29/2020   MCV 99.3 08/29/2020   PLT 279 08/29/2020      Component Value Date/Time   NA 143 09/18/2022 1008   K 4.6 09/18/2022 1008   CL 104 09/18/2022 1008   CO2 22 09/18/2022 1008   GLUCOSE 70 09/18/2022 1008   GLUCOSE 120 (H) 08/29/2020 1231   BUN 17 09/18/2022 1008   CREATININE 1.13 09/18/2022 1008   CALCIUM  9.3 09/18/2022 1008   PROT 6.4 09/18/2022 1008   ALBUMIN 4.4 09/18/2022 1008   AST 33 09/18/2022 1008   ALT 33 09/18/2022 1008   ALKPHOS 105 09/18/2022 1008   BILITOT 0.3 09/18/2022 1008   GFRNONAA >60 08/29/2020 1231   GFRAA >60 10/11/2019 0937   Lab Results  Component Value Date   CHOL  124 07/02/2022  HDL 91 07/02/2022   LDLCALC 21 07/02/2022   LDLDIRECT 121.2 03/12/2012   TRIG 49 07/02/2022   CHOLHDL 1.4 07/02/2022   No results found for: "HGBA1C" No results found for: "VITAMINB12" No results found for: "TSH"     ASSESSMENT AND PLAN 68 y.o. year old male  has a past medical history of Blindness of right eye, CAD (coronary artery disease), Hyperlipidemia, and Seizures (HCC). here with     ICD-10-CM   1. Partial idiopathic epilepsy with seizures of localized onset, not intractable, without status epilepticus (HCC)  G40.009 Carbamazepine  level, total    CMP    CBC with Differential/Platelets       Dante is doing well from a seizure perspective.  He is tolerating carbamazepine  with no obvious adverse effects. He will continue carbamazepine  400mg  in am and 600mg  in pm. He will continue close follow-up with PCP for annual wellness and management of cholesterol.  Regular exercise and well-balanced diet advised.  He will follow-up with me in 1 year, sooner if needed. He verbalizes understanding and agreement with this plan.    Orders Placed This Encounter  Procedures   Carbamazepine  level, total   CMP   CBC with Differential/Platelets     Meds ordered this encounter  Medications   carbamazepine  (TEGRETOL  XR) 200 MG 12 hr tablet    Sig: Take 2 tablets (400mg ) in the am and 3 tablets (600mg ) in the pm    Dispense:  450 tablet    Refill:  3    Supervising Provider:   AHERN, ANTONIA B [[id number]]     I personally spent a total of 30 minutes in the care of the patient today including preparing to see the patient, getting/reviewing separately obtained history, performing a medically appropriate exam/evaluation, counseling and educating, placing orders, documenting clinical information in the EHR, and independently interpreting results.   Terrilyn Fick, FNP-C 09/23/2023, 8:57 AM [facility] [address], [address] [place], [place] [address] [phone]

## 2023-09-23 ENCOUNTER — Encounter: Payer: Self-pay | Admitting: Family Medicine

## 2023-09-23 ENCOUNTER — Ambulatory Visit (INDEPENDENT_AMBULATORY_CARE_PROVIDER_SITE_OTHER): Payer: BC Managed Care – PPO | Admitting: Family Medicine

## 2023-09-23 VITALS — BP 115/73 | HR 65 | Ht 66.0 in | Wt 156.0 lb

## 2023-09-23 DIAGNOSIS — G40009 Localization-related (focal) (partial) idiopathic epilepsy and epileptic syndromes with seizures of localized onset, not intractable, without status epilepticus: Secondary | ICD-10-CM | POA: Diagnosis not present

## 2023-09-23 MED ORDER — CARBAMAZEPINE ER 200 MG PO TB12: ORAL_TABLET | ORAL | 3 refills | Status: AC

## 2023-09-24 ENCOUNTER — Ambulatory Visit: Payer: Self-pay | Admitting: Family Medicine

## 2023-09-24 LAB — CBC WITH DIFFERENTIAL/PLATELET
Basophils Absolute: 0 10*3/uL (ref 0.0–0.2)
Basos: 1 %
EOS (ABSOLUTE): 0.1 10*3/uL (ref 0.0–0.4)
Eos: 2 %
Hematocrit: 47 % (ref 37.5–51.0)
Hemoglobin: 15.3 g/dL (ref 13.0–17.7)
Immature Grans (Abs): 0 10*3/uL (ref 0.0–0.1)
Immature Granulocytes: 0 %
Lymphocytes Absolute: 2 10*3/uL (ref 0.7–3.1)
Lymphs: 39 %
MCH: 33.1 pg — ABNORMAL HIGH (ref 26.6–33.0)
MCHC: 32.6 g/dL (ref 31.5–35.7)
MCV: 102 fL — ABNORMAL HIGH (ref 79–97)
Monocytes Absolute: 0.7 10*3/uL (ref 0.1–0.9)
Monocytes: 13 %
Neutrophils Absolute: 2.3 10*3/uL (ref 1.4–7.0)
Neutrophils: 45 %
Platelets: 274 10*3/uL (ref 150–450)
RBC: 4.62 x10E6/uL (ref 4.14–5.80)
RDW: 12.7 % (ref 11.6–15.4)
WBC: 5.1 10*3/uL (ref 3.4–10.8)

## 2023-09-24 LAB — COMPREHENSIVE METABOLIC PANEL WITH GFR
ALT: 27 IU/L (ref 0–44)
AST: 20 IU/L (ref 0–40)
Albumin: 4.6 g/dL (ref 3.9–4.9)
Alkaline Phosphatase: 108 IU/L (ref 44–121)
BUN/Creatinine Ratio: 14 (ref 10–24)
BUN: 17 mg/dL (ref 8–27)
Bilirubin Total: 0.3 mg/dL (ref 0.0–1.2)
CO2: 22 mmol/L (ref 20–29)
Calcium: 9.1 mg/dL (ref 8.6–10.2)
Chloride: 102 mmol/L (ref 96–106)
Creatinine, Ser: 1.19 mg/dL (ref 0.76–1.27)
Globulin, Total: 1.7 g/dL (ref 1.5–4.5)
Glucose: 87 mg/dL (ref 70–99)
Potassium: 4.4 mmol/L (ref 3.5–5.2)
Sodium: 140 mmol/L (ref 134–144)
Total Protein: 6.3 g/dL (ref 6.0–8.5)
eGFR: 68 mL/min/{1.73_m2} (ref >59)

## 2023-09-24 LAB — CARBAMAZEPINE LEVEL, TOTAL: Carbamazepine (Tegretol), S: 9 ug/mL (ref 4.0–12.0)

## 2023-09-25 NOTE — Telephone Encounter (Signed)
 Labs faxed to  [phone], Dr.Tisovec, confirmation received.

## 2023-11-27 ENCOUNTER — Telehealth: Payer: Self-pay | Admitting: Pharmacy Technician

## 2023-11-27 NOTE — Telephone Encounter (Signed)
 Pharmacy Patient Advocate Encounter  Received notification from [employer] that Prior Authorization for repatha  has been APPROVED from 11/27/23 to 04/21/24

## 2023-11-27 NOTE — Telephone Encounter (Signed)
   Pharmacy Patient Advocate Encounter   Received notification from CoverMyMeds that prior authorization for repatha  is required/requested.   Insurance verification completed.   The patient is insured through [facility] .   Per test claim: PA required; PA submitted to above mentioned insurance via latent Key/confirmation #/EOC [id number] Status is pending

## 2024-03-11 NOTE — Progress Notes (Signed)
 HPI: FU coronary artery disease. Cardiac CTA August 2013 showed calcium  score 86.4 which was 82nd percentile; mild disease in the LAD and right coronary artery; 2 small left pulmonary nodules.  Abdominal ultrasound November 2023 showed no aneurysm.  Calcium  score December 2023 210 which was 70th percentile.  Carotid Dopplers January 2024 showed no significant stenosis. Since last seen, the patient denies any dyspnea on exertion, orthopnea, PND, pedal edema, palpitations, syncope or chest pain.   Current Outpatient Medications  Medication Sig Dispense Refill   aspirin 81 MG chewable tablet Chew 81 mg by mouth daily.     carbamazepine  (TEGRETOL  XR) 200 MG 12 hr tablet Take 2 tablets (400mg ) in the am and 3 tablets (600mg ) in the pm 450 tablet 3   Evolocumab  (REPATHA  SURECLICK) 140 MG/ML SOAJ INJECT 140 MG INTO THE SKIN EVERY 14 (FOURTEEN) DAYS. 6 mL 3   ezetimibe (ZETIA) 10 MG tablet Take 10 mg by mouth daily.     rosuvastatin (CRESTOR) 20 MG tablet Take 20 mg by mouth daily.     tamsulosin (FLOMAX) 0.4 MG CAPS capsule Take 0.8 mg by mouth daily.     No current facility-administered medications for this visit.     Past Medical History:  Diagnosis Date   Blindness of right eye    childhood incident   CAD (coronary artery disease)    Hyperlipidemia    Seizures (HCC)     Past Surgical History:  Procedure Laterality Date   ANKLE SURGERY     EYE SURGERY     TONSILLECTOMY      Social History   Socioeconomic History   Marital status: Married    Spouse name: Not on file   Number of children: 2   Years of education: Not on file   Highest education level: Bachelor's degree (e.g., BA, AB, BS)  Occupational History    Comment: IT consultant  Tobacco Use   Smoking status: Never   Smokeless tobacco: Never  Vaping Use   Vaping status: Never Used  Substance and Sexual Activity   Alcohol  use: Yes    Comment: Occasional   Drug use: No   Sexual activity: Yes  Other Topics  Concern   Not on file  Social History Narrative   Lives at home with his wife   Right handed   Caffeine: 4 cups daily   Social Drivers of Corporate Investment Banker Strain: Not on file  Food Insecurity: Not on file  Transportation Needs: Not on file  Physical Activity: Not on file  Stress: Not on file  Social Connections: Unknown (07/11/2022)   Received from [facility]   Social Network    Social Network: Not on file  Intimate Partner Violence: Unknown (07/11/2022)   Received from [facility]   HITS    Physically Hurt: Not on file    Insult or Talk Down To: Not on file    Threaten Physical Harm: Not on file    Scream or Curse: Not on file    Family History  Problem Relation Age of Onset   Stroke Mother    Heart attack Father        MI at age 65   Heart disease Father    Seizures Neg Hx     ROS: no fevers or chills, productive cough, hemoptysis, dysphasia, odynophagia, melena, hematochezia, dysuria, hematuria, rash, seizure activity, orthopnea, PND, pedal edema, claudication. Remaining systems are negative.  Physical Exam: Well-developed well-nourished in no acute  distress.  Skin is warm and dry.  HEENT is normal.  Neck is supple.  Chest is clear to auscultation with normal expansion.  Cardiovascular exam is regular rate and rhythm.  Abdominal exam nontender or distended. No masses palpated. Extremities show no edema. neuro grossly intact  EKG Interpretation Date/Time:  Thursday March 25 2024 08:47:32 EST Ventricular Rate:  66 PR Interval:  166 QRS Duration:  88 QT Interval:  384 QTC Calculation: 402 R Axis:   5  Text Interpretation: Normal sinus rhythm Normal ECG Confirmed by Pietro Rogue ([id number]) on 03/25/2024 8:54:03 AM    A/P  1 coronary calcification-patient denies chest pain.  Continue aspirin and statin.  2 hyperlipidemia-continue Crestor, Repatha  and Zetia.  Check lipids, LPa and liver.   Rogue Pietro, MD

## 2024-03-25 ENCOUNTER — Ambulatory Visit: Attending: Cardiology | Admitting: Cardiology

## 2024-03-25 ENCOUNTER — Encounter: Payer: Self-pay | Admitting: Cardiology

## 2024-03-25 VITALS — BP 122/62 | HR 66 | Ht 67.0 in | Wt 150.0 lb

## 2024-03-25 DIAGNOSIS — E785 Hyperlipidemia, unspecified: Secondary | ICD-10-CM | POA: Insufficient documentation

## 2024-03-25 DIAGNOSIS — I2510 Atherosclerotic heart disease of native coronary artery without angina pectoris: Secondary | ICD-10-CM | POA: Insufficient documentation

## 2024-03-25 NOTE — Patient Instructions (Signed)

## 2024-03-26 ENCOUNTER — Ambulatory Visit: Payer: Self-pay | Admitting: Cardiology

## 2024-03-26 LAB — LIPID PANEL
Chol/HDL Ratio: 1.5 ratio (ref 0.0–5.0)
Cholesterol, Total: 130 mg/dL (ref 100–199)
HDL: 89 mg/dL (ref >39)
LDL Chol Calc (NIH): 26 mg/dL (ref 0–99)
Triglycerides: 77 mg/dL (ref 0–149)
VLDL Cholesterol Cal: 15 mg/dL (ref 5–40)

## 2024-03-26 LAB — HEPATIC FUNCTION PANEL
ALT: 28 IU/L (ref 0–44)
AST: 22 IU/L (ref 0–40)
Albumin: 4.4 g/dL (ref 3.9–4.9)
Alkaline Phosphatase: 104 IU/L (ref 47–123)
Bilirubin Total: 0.2 mg/dL (ref 0.0–1.2)
Bilirubin, Direct: 0.12 mg/dL (ref 0.00–0.40)
Total Protein: 6.7 g/dL (ref 6.0–8.5)

## 2024-03-26 LAB — LIPOPROTEIN A (LPA): Lipoprotein (a): 221 nmol/L — AB (ref <75.0)

## 2024-03-30 ENCOUNTER — Encounter: Payer: Self-pay | Admitting: Cardiology

## 2024-03-30 DIAGNOSIS — E785 Hyperlipidemia, unspecified: Secondary | ICD-10-CM

## 2024-03-30 DIAGNOSIS — I2510 Atherosclerotic heart disease of native coronary artery without angina pectoris: Secondary | ICD-10-CM

## 2024-03-30 MED ORDER — REPATHA SURECLICK 140 MG/ML ~~LOC~~ SOAJ: 140.0000 mg | SUBCUTANEOUS | 3 refills | Status: AC

## 2024-09-21 ENCOUNTER — Ambulatory Visit: Admitting: Family Medicine

## 2024-09-23 ENCOUNTER — Ambulatory Visit: Admitting: Family Medicine
